# Patient Record
Sex: Female | Born: 1955 | Race: Black or African American | Hispanic: No | Marital: Married | Smoking: Never smoker
Health system: Southern US, Community
[De-identification: ages and names within clinical notes are randomized; demographics above are authoritative.]

## PROBLEM LIST (undated history)

## (undated) DIAGNOSIS — Z87442 Personal history of urinary calculi: Secondary | ICD-10-CM

## (undated) DIAGNOSIS — N200 Calculus of kidney: Secondary | ICD-10-CM

## (undated) DIAGNOSIS — F419 Anxiety disorder, unspecified: Secondary | ICD-10-CM

## (undated) DIAGNOSIS — M8580 Other specified disorders of bone density and structure, unspecified site: Secondary | ICD-10-CM

## (undated) DIAGNOSIS — D259 Leiomyoma of uterus, unspecified: Secondary | ICD-10-CM

## (undated) DIAGNOSIS — M1990 Unspecified osteoarthritis, unspecified site: Secondary | ICD-10-CM

## (undated) DIAGNOSIS — I10 Essential (primary) hypertension: Secondary | ICD-10-CM

## (undated) DIAGNOSIS — J189 Pneumonia, unspecified organism: Secondary | ICD-10-CM

## (undated) DIAGNOSIS — G43909 Migraine, unspecified, not intractable, without status migrainosus: Secondary | ICD-10-CM

## (undated) DIAGNOSIS — K760 Fatty (change of) liver, not elsewhere classified: Secondary | ICD-10-CM

## (undated) DIAGNOSIS — Z8742 Personal history of other diseases of the female genital tract: Secondary | ICD-10-CM

## (undated) HISTORY — PX: APPENDECTOMY: SHX54

## (undated) HISTORY — DX: Migraine, unspecified, not intractable, without status migrainosus: G43.909

## (undated) HISTORY — DX: Personal history of other diseases of the female genital tract: Z87.42

## (undated) HISTORY — PX: WISDOM TOOTH EXTRACTION: SHX21

## (undated) HISTORY — PX: VAGINAL HYSTERECTOMY: SUR661

## (undated) HISTORY — PX: CHOLECYSTECTOMY: SHX55

## (undated) HISTORY — PX: TONSILECTOMY, ADENOIDECTOMY, BILATERAL MYRINGOTOMY AND TUBES: SHX2538

## (undated) HISTORY — DX: Leiomyoma of uterus, unspecified: D25.9

## (undated) HISTORY — DX: Other specified disorders of bone density and structure, unspecified site: M85.80

## (undated) HISTORY — PX: FRACTURE SURGERY: SHX138

## (undated) HISTORY — DX: Calculus of kidney: N20.0

---

## 2001-06-23 ENCOUNTER — Encounter: Payer: Self-pay | Admitting: Obstetrics and Gynecology

## 2001-06-23 ENCOUNTER — Encounter: Admission: RE | Admit: 2001-06-23 | Discharge: 2001-06-23 | Payer: Self-pay | Admitting: Obstetrics and Gynecology

## 2002-05-21 HISTORY — PX: REFRACTIVE SURGERY: SHX103

## 2002-07-10 ENCOUNTER — Encounter: Admission: RE | Admit: 2002-07-10 | Discharge: 2002-07-10 | Payer: Self-pay | Admitting: Obstetrics and Gynecology

## 2002-07-10 ENCOUNTER — Encounter: Payer: Self-pay | Admitting: Obstetrics and Gynecology

## 2003-06-21 ENCOUNTER — Encounter (INDEPENDENT_AMBULATORY_CARE_PROVIDER_SITE_OTHER): Payer: Self-pay | Admitting: Specialist

## 2003-06-21 ENCOUNTER — Ambulatory Visit (HOSPITAL_COMMUNITY): Admission: RE | Admit: 2003-06-21 | Discharge: 2003-06-21 | Payer: Self-pay | Admitting: Gastroenterology

## 2004-12-21 ENCOUNTER — Encounter: Admission: RE | Admit: 2004-12-21 | Discharge: 2004-12-21 | Payer: Self-pay | Admitting: Obstetrics and Gynecology

## 2006-01-01 ENCOUNTER — Encounter: Admission: RE | Admit: 2006-01-01 | Discharge: 2006-01-01 | Payer: Self-pay | Admitting: Obstetrics and Gynecology

## 2006-01-09 ENCOUNTER — Encounter: Admission: RE | Admit: 2006-01-09 | Discharge: 2006-01-09 | Payer: Self-pay | Admitting: Obstetrics and Gynecology

## 2006-01-09 ENCOUNTER — Encounter: Admission: RE | Admit: 2006-01-09 | Discharge: 2006-01-09 | Payer: Self-pay | Admitting: Emergency Medicine

## 2006-01-31 ENCOUNTER — Ambulatory Visit (HOSPITAL_COMMUNITY): Admission: RE | Admit: 2006-01-31 | Discharge: 2006-01-31 | Payer: Self-pay | Admitting: Gastroenterology

## 2006-06-21 ENCOUNTER — Encounter: Admission: RE | Admit: 2006-06-21 | Discharge: 2006-06-21 | Payer: Self-pay | Admitting: Obstetrics and Gynecology

## 2007-01-06 ENCOUNTER — Encounter: Admission: RE | Admit: 2007-01-06 | Discharge: 2007-01-06 | Payer: Self-pay | Admitting: Obstetrics and Gynecology

## 2008-01-07 ENCOUNTER — Encounter: Admission: RE | Admit: 2008-01-07 | Discharge: 2008-01-07 | Payer: Self-pay | Admitting: Obstetrics and Gynecology

## 2009-01-06 ENCOUNTER — Encounter: Admission: RE | Admit: 2009-01-06 | Discharge: 2009-01-06 | Payer: Self-pay | Admitting: Obstetrics and Gynecology

## 2010-01-09 ENCOUNTER — Encounter: Admission: RE | Admit: 2010-01-09 | Discharge: 2010-01-09 | Payer: Self-pay | Admitting: Obstetrics and Gynecology

## 2010-06-11 ENCOUNTER — Encounter: Payer: Self-pay | Admitting: Gastroenterology

## 2010-10-06 NOTE — Op Note (Signed)
NAME:  Tracy Frye, Tracy Frye                 ACCOUNT NO.:  [id number]   MEDICAL RECORD NO.:  [id number]          PATIENT TYPE:  AMB   LOCATION:  ENDO                         FACILITY:  [facility]   PHYSICIAN:  John C. Madilyn Fireman, M.D.    DATE OF BIRTH:  1955/10/29   DATE OF PROCEDURE:  01/31/2006  DATE OF DISCHARGE:                                 OPERATIVE REPORT   PROCEDURE:  Endoscopic retrograde cholangiopancreatography with  sphincterotomy and stone extraction.   INDICATIONS FOR PROCEDURE:  Abdominal pain with elevated liver function  tests and gallstones and dilated common bile duct seen on ultrasound.   PROCEDURE:  The patient was placed in the prone position and placed on the  pulse monitor with continuous low-flow oxygen delivered by nasal cannula.  She was sedated with 160 micrograms IV fentanyl and 12.5 mg IV Versed.  Olympus video side-viewing endoscope was advanced blindly in the oropharynx,  esophagus and stomach.  The pylorus was traversed and papilla of Vater  located on the medial duodenal wall, had normal appearance and was ejecting  clear amber bile. Deep cannulation was achieved on the first attempt with  the Wilson-Cook sphincterotome and a cholangiogram obtained that showed a  tapered dilated duct with distal filling defect approximately 3-4 mm in  diameter.  A large sphincterotomy was performed and with the 8-15 mm  adjustable balloon the stone was pulled through the ampulla. Another balloon  sweep was made with no further stones delivered and occlusion cholangiogram  obtained which showed no further filling defects.  The scope was then  withdrawn and the patient returned to the recovery room in stable condition.  She tolerated the procedure well.  There were no immediate complications.   IMPRESSION:  Single distal common bile duct stone removed after  sphincterotomy.   PLAN:  We will arrange surgical consultation for cholecystectomy.            ______________________________  Everardo All Madilyn Fireman, M.D.     JCH/MEDQ  D:  01/31/2006  T:  02/01/2006  Job:  [id number]   cc:   Lillia Carmel, M.D.

## 2010-10-06 NOTE — Op Note (Signed)
NAME:  Tracy Frye, Tracy Frye                           ACCOUNT NO.:  [id number]   MEDICAL RECORD NO.:  [id number]                   PATIENT TYPE:  AMB   LOCATION:  ENDO                                 FACILITY:  [facility]   PHYSICIAN:  John C. Madilyn Fireman, M.D.                 DATE OF BIRTH:  [birth year]   DATE OF PROCEDURE:  06/21/2003  DATE OF DISCHARGE:                                 OPERATIVE REPORT   PROCEDURE:  Colonoscopy.   INDICATIONS FOR PROCEDURE:  Change in bowel habits, nocturnal diarrhea,  incontinence, and rectal bleeding in a 55 year old patient with no prior  colon screening.   DESCRIPTION OF PROCEDURE:  The patient was placed in the left lateral  decubitus position and placed on the pulse monitor with continuous low-flow  oxygen delivered by nasal cannula.  She was sedated with 125 mcg IV fentanyl  and 11 mg IV Versed.  The Olympus video colonoscope was inserted into the  rectum and advanced to the cecum, confirmed by transillumination of  McBurney's point and visualization of the ileocecal valve and appendiceal  orifice.  Prep was excellent.  The cecum, ascending, transverse, descending,  and sigmoid colon all appeared normal with no masses, polyps, diverticula,  or other mucosal abnormalities.  The rectum likewise appeared normal and  retroflexed view of the anus did reveal some small internal hemorrhoids.  Biopsies were taken to rule out microscopic colitis. The scope was then  withdrawn and the patient returned to the recovery room in stable condition.  She tolerated the procedure well and there were no immediate complications.   IMPRESSION:  Small internal hemorrhoids.  Otherwise normal study.   PLAN:  Await biopsy results. If negative, will probably start on an  antispasmodic.                                               John C. Madilyn Fireman, M.D.    JCH/MEDQ  D:  06/21/2003  T:  06/21/2003  Job:  [id number]

## 2011-01-23 ENCOUNTER — Other Ambulatory Visit: Payer: Self-pay | Admitting: Obstetrics and Gynecology

## 2011-01-23 DIAGNOSIS — Z1231 Encounter for screening mammogram for malignant neoplasm of breast: Secondary | ICD-10-CM

## 2011-02-14 ENCOUNTER — Ambulatory Visit: Payer: Self-pay

## 2011-04-18 ENCOUNTER — Other Ambulatory Visit: Payer: Self-pay | Admitting: Gastroenterology

## 2011-04-18 DIAGNOSIS — K838 Other specified diseases of biliary tract: Secondary | ICD-10-CM

## 2011-04-23 ENCOUNTER — Ambulatory Visit
Admission: RE | Admit: 2011-04-23 | Discharge: 2011-04-23 | Disposition: A | Discharge status: Home / Self Care | Payer: Managed Care, Other (non HMO) | Source: Ambulatory Visit | Attending: Gastroenterology | Admitting: Gastroenterology

## 2011-04-23 DIAGNOSIS — K838 Other specified diseases of biliary tract: Secondary | ICD-10-CM

## 2011-11-21 ENCOUNTER — Ambulatory Visit
Admission: RE | Admit: 2011-11-21 | Discharge: 2011-11-21 | Disposition: A | Discharge status: Home / Self Care | Payer: Managed Care, Other (non HMO) | Source: Ambulatory Visit | Attending: Obstetrics and Gynecology | Admitting: Obstetrics and Gynecology

## 2011-11-21 DIAGNOSIS — Z1231 Encounter for screening mammogram for malignant neoplasm of breast: Secondary | ICD-10-CM

## 2012-12-05 ENCOUNTER — Other Ambulatory Visit: Payer: Self-pay

## 2012-12-05 DIAGNOSIS — Z1231 Encounter for screening mammogram for malignant neoplasm of breast: Secondary | ICD-10-CM

## 2012-12-19 ENCOUNTER — Ambulatory Visit: Payer: Managed Care, Other (non HMO)

## 2013-10-19 ENCOUNTER — Other Ambulatory Visit: Payer: Self-pay

## 2013-11-04 ENCOUNTER — Ambulatory Visit
Admission: RE | Admit: 2013-11-04 | Discharge: 2013-11-04 | Disposition: A | Discharge status: Home / Self Care | Payer: Managed Care, Other (non HMO) | Source: Ambulatory Visit

## 2013-11-04 DIAGNOSIS — Z1231 Encounter for screening mammogram for malignant neoplasm of breast: Secondary | ICD-10-CM

## 2013-11-19 ENCOUNTER — Ambulatory Visit (INDEPENDENT_AMBULATORY_CARE_PROVIDER_SITE_OTHER): Payer: Managed Care, Other (non HMO)

## 2013-11-19 ENCOUNTER — Ambulatory Visit (INDEPENDENT_AMBULATORY_CARE_PROVIDER_SITE_OTHER): Payer: Managed Care, Other (non HMO) | Admitting: Podiatrist

## 2013-11-19 ENCOUNTER — Encounter: Payer: Self-pay | Admitting: Podiatrist

## 2013-11-19 VITALS — BP 118/67 | HR 74 | Resp 16 | Ht 67.0 in | Wt 185.0 lb

## 2013-11-19 DIAGNOSIS — M779 Enthesopathy, unspecified: Secondary | ICD-10-CM

## 2013-11-19 DIAGNOSIS — M7752 Other enthesopathy of left foot: Secondary | ICD-10-CM

## 2013-11-19 DIAGNOSIS — M7750 Other enthesopathy of unspecified foot: Secondary | ICD-10-CM

## 2013-11-19 MED ORDER — MELOXICAM 15 MG PO TABS: 15.0000 mg | ORAL_TABLET | Freq: Every day | ORAL | Status: DC

## 2013-11-19 MED ORDER — DEXAMETHASONE SODIUM PHOSPHATE 120 MG/30ML IJ SOLN: 4.0000 mg | Freq: Once | INTRAMUSCULAR | Status: DC

## 2013-11-19 NOTE — Patient Instructions (Signed)
Sprain °A sprain happens when the bands of tissue that connect bones and hold joints together (ligaments) stretch too much or tear. °HOME CARE °· Raise (elevate) the injured area to lessen puffiness (swelling). °· Put ice on the injured area 2 times a day for 2-3 days. °¨ Put ice in a plastic bag. °¨ Place a towel between your skin and the bag. °¨ Leave the ice on for 15 minutes. °· Only take medicine as told by your doctor. °· Protect your injured area until your pain and stiffness go away. °· Do not get your cast or splint wet. Cover your cast or splint with a plastic bag when you shower or take a bath. Do not swim in a pool. °· Your doctor may suggest exercises during your recovery to keep from getting stiff. °GET HELP RIGHT AWAY IF:  °· Your cast or splint becomes damaged. °· Your pain gets worse. °MAKE SURE YOU:  °· Understand these instructions. °· Will watch this condition. °· Will get help right away if you are not doing well or get worse. °Document Released: 10/24/2007 Document Revised: 02/25/2013 Document Reviewed: 05/19/2011 °ExitCare® Patient Information ©2015 [employer]. This information is not intended to replace advice given to you by your health care provider. Make sure you discuss any questions you have with your health care provider. ° °

## 2013-11-19 NOTE — Progress Notes (Signed)
   Subjective:    Patient ID: Tracy Frye, female    DOB: 21-Jul-1955, 58 y.o.   MRN: [id number]  HPI Comments: Its my left ankle. Its been hurting off an on for 2 -3 months. Its getting worse. At night its a throbbing pain. i wear a compression sock, good shoes with support. i take a benadryl and tylenol combination for it.  Foot Pain      Review of Systems  Gastrointestinal: Positive for diarrhea.  Musculoskeletal:       Joint pain Back pain Muscle pain  Allergic/Immunologic: Positive for environmental allergies.  Hematological: Bruises/bleeds easily.  All other systems reviewed and are negative.      Objective:   Physical Exam Patient is awake, alert, and oriented x 3.  In no acute distress.  Vascular status is intact with palpable pedal pulses at 2/4 DP and PT bilateral and capillary refill time within normal limits. Neurological sensation is also intact bilaterally via Semmes Weinstein monofilament at 5/5 sites. Light touch, vibratory sensation, Achilles tendon reflex is intact. Dermatological exam reveals skin color, turger and texture as normal. No open lesions present.  Musculature intact with dorsiflexion, plantarflexion, inversion, eversion.  Pain on lateral ankle at th sinus tarsi is noted with direct pressure.  No pain along the peroneal tendons is noted. No pain with anterior drawer test. Pain with dorsal lateral movement in the ankle joint is noted. X-rays show some slight arthritis only.    Assessment & Plan:  Sinus tarsi syndrome, capsulitis left Plan: Injected into the sinus tarsi of the left foot. Recommended a soft ankle brace. If there is no improvement she will call in 2 weeks we will consider an MRI. Also recommended MOBIC anti-inflammatory which was electronically prescribed.

## 2013-11-20 ENCOUNTER — Telehealth: Payer: Self-pay | Admitting: *Deleted

## 2013-11-20 MED ORDER — MELOXICAM 15 MG PO TABS: 15.0000 mg | ORAL_TABLET | Freq: Every day | ORAL | Status: DC

## 2013-11-20 NOTE — Telephone Encounter (Signed)
The pain medication that doctor egerton was going to prescribe was not at pharmacy, resent medication to pharmacy

## 2013-11-26 ENCOUNTER — Other Ambulatory Visit: Payer: Self-pay | Admitting: *Deleted

## 2013-11-26 ENCOUNTER — Telehealth: Payer: Self-pay | Admitting: *Deleted

## 2013-11-26 MED ORDER — DICLOFENAC SODIUM 75 MG PO TBEC: 75.0000 mg | DELAYED_RELEASE_TABLET | Freq: Two times a day (BID) | ORAL | Status: DC

## 2013-11-26 MED ORDER — TRAMADOL HCL 50 MG PO TABS: ORAL_TABLET | ORAL | Status: DC

## 2013-11-26 NOTE — Telephone Encounter (Signed)
Pt called stated she is dr egertons pt and is having pain in her left ankle and is wanting pain medicine. States she has been taking mobic and its not helping. What do you suggest?

## 2013-11-26 NOTE — Telephone Encounter (Signed)
Change to Diclofenac 75mg  one by mouth twice daily. #60 1 refill also add Tramadol 50 mg #50, 1-2 by mouth every six to eight hours.

## 2013-11-26 NOTE — Telephone Encounter (Signed)
PER DR HYATT PT CAN HAVE DICLOFENAC 75 MG, TAKE TWICE DAILY, #60 WITH 1 REFILL AND TRAMADOL 50 MG, #50 ONE TO TWO BY MOUTH EVERY 6 -8 HRS PRN PAIN NO REFILLS. PT IS AWARE THAT SHE WILL NEED TO COME BY OFFICE TO PICK UP RX.

## 2014-02-25 ENCOUNTER — Other Ambulatory Visit: Payer: Self-pay | Admitting: *Deleted

## 2014-02-25 MED ORDER — MELOXICAM 15 MG PO TABS: 15.0000 mg | ORAL_TABLET | Freq: Every day | ORAL | Status: DC

## 2014-02-25 NOTE — Telephone Encounter (Signed)
Refill req from [facility]. Refilled meloxicam 15 mg #30 with 2 refills.

## 2014-03-02 ENCOUNTER — Other Ambulatory Visit: Payer: Self-pay | Admitting: *Deleted

## 2014-03-02 MED ORDER — MELOXICAM 15 MG PO TABS: 15.0000 mg | ORAL_TABLET | Freq: Every day | ORAL | Status: DC

## 2014-03-02 NOTE — Telephone Encounter (Signed)
FAX REQUEST FROM [facility] REQ MELOXICAM 15 MG #30. REFILLED PER DR EGERTON.

## 2014-09-28 ENCOUNTER — Ambulatory Visit: Payer: Managed Care, Other (non HMO) | Admitting: Podiatrist

## 2014-09-29 ENCOUNTER — Encounter: Payer: Self-pay | Admitting: Podiatrist

## 2014-09-29 ENCOUNTER — Ambulatory Visit (INDEPENDENT_AMBULATORY_CARE_PROVIDER_SITE_OTHER): Payer: Managed Care, Other (non HMO) | Admitting: Podiatrist

## 2014-09-29 VITALS — BP 127/80 | HR 70 | Resp 12

## 2014-09-29 DIAGNOSIS — B351 Tinea unguium: Secondary | ICD-10-CM

## 2014-09-29 DIAGNOSIS — M7752 Other enthesopathy of left foot: Secondary | ICD-10-CM | POA: Diagnosis not present

## 2014-09-29 NOTE — Patient Instructions (Signed)

## 2014-09-29 NOTE — Progress Notes (Signed)
l ankle sinus tarsi injection... And sample left hallux nail.  Chief Complaint  Patient presents with  . Foot Pain    ''LT FOOT ANKLE START SWELLING AND PAINFUL FOR ABOUT 1 MONTH.''     HPI: Patient is 59 y.o. female who presents today for recurrence of pain along the outside of the left ankle.  She had this problem about a year ago and a sinus tarsi injection helped her discomfort.  She also has a hallux nail on the left foot she is concerned about being fungal.    Allergies  Allergen Reactions  . Sulfa Antibiotics Rash    Physical Exam  Neurovascular status is intact and unchanged. Pes planus with impingement along the sinus tarsi is noted.  Pain along the sinus tarsi of the left ankle is noted in consistent with the pain she experienced before. Left hallux is somewhat thickened with a darkish streak down the nail. Does appear consistent with age related changes and skin pigmentation of the nail  Assessment:  Sinus tarsi syndrome left ankle, dystrophy of hallux nail left  Plan: under sterile technique the sinus tarsi was injected on the left ankle.  The left hallux  Nail sample was sent for identification.  Will notify her of the result.  She will call if the injection is not beneficial.

## 2015-01-13 ENCOUNTER — Other Ambulatory Visit: Payer: Self-pay

## 2015-01-13 DIAGNOSIS — Z1231 Encounter for screening mammogram for malignant neoplasm of breast: Secondary | ICD-10-CM

## 2015-01-27 ENCOUNTER — Ambulatory Visit: Payer: Managed Care, Other (non HMO)

## 2015-02-08 ENCOUNTER — Telehealth: Payer: Self-pay | Admitting: *Deleted

## 2015-02-08 NOTE — Telephone Encounter (Signed)
Pt request refill of a medication.  I spoke with pt she request refill of Diclofenac.  I did not see that rx prescribed in our listing and pt needs to be evaluated and established with a new doctor.  I offered pt an appt, she says she will check her schedule and call back.

## 2015-02-23 ENCOUNTER — Ambulatory Visit: Payer: Managed Care, Other (non HMO) | Admitting: Podiatry

## 2015-03-16 ENCOUNTER — Ambulatory Visit: Payer: Managed Care, Other (non HMO)

## 2015-03-18 ENCOUNTER — Ambulatory Visit (INDEPENDENT_AMBULATORY_CARE_PROVIDER_SITE_OTHER): Payer: Managed Care, Other (non HMO)

## 2015-03-18 ENCOUNTER — Ambulatory Visit (INDEPENDENT_AMBULATORY_CARE_PROVIDER_SITE_OTHER): Payer: Managed Care, Other (non HMO) | Admitting: Sports Medicine

## 2015-03-18 ENCOUNTER — Encounter: Payer: Self-pay | Admitting: Sports Medicine

## 2015-03-18 DIAGNOSIS — M2142 Flat foot [pes planus] (acquired), left foot: Secondary | ICD-10-CM

## 2015-03-18 DIAGNOSIS — M25572 Pain in left ankle and joints of left foot: Secondary | ICD-10-CM

## 2015-03-18 DIAGNOSIS — M2141 Flat foot [pes planus] (acquired), right foot: Secondary | ICD-10-CM

## 2015-03-18 DIAGNOSIS — M7752 Other enthesopathy of left foot: Secondary | ICD-10-CM

## 2015-03-18 MED ORDER — TRIAMCINOLONE ACETONIDE 10 MG/ML IJ SUSP: 10.0000 mg | Freq: Once | INTRAMUSCULAR | Status: DC

## 2015-03-18 MED ORDER — DICLOFENAC SODIUM 75 MG PO TBEC: 75.0000 mg | DELAYED_RELEASE_TABLET | Freq: Two times a day (BID) | ORAL | Status: DC

## 2015-03-18 NOTE — Progress Notes (Signed)
Patient ID: Tracy CollumSharon M Corkum, female   DOB: 10/20/1955, 59 y.o.   MRN: [id number] Subjective: Tracy Frye is a 59 y.o. female patient who presents to office for evaluation of Left ankle pain. 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 Reports that Dr. Irving ShowsEgerton gave her an injection and anti-inflammatories in May which helped a lot. Patient desires a repeat injection. Patient denies any other pedal complaints. Denies recent injury/trip/fall/sprain/any causative factors.   There are no active problems to display for this patient.  Current Outpatient Prescriptions on File Prior to Visit  Medication Sig Dispense Refill  . meloxicam (MOBIC) 15 MG tablet Take 1 tablet (15 mg total) by mouth daily. 30 tablet 2   Current Facility-Administered Medications on File Prior to Visit  Medication Dose Route Frequency Provider Last Rate Last Dose  . dexamethasone (DECADRON) injection 4 mg  4 mg Intra-articular Once Delories HeinzKathryn P Egerton, DPM       Allergies  Allergen Reactions  . Sulfa Antibiotics Rash   Objective:  General: Alert and oriented x3 in no acute distress  Dermatology: No open lesions bilateral lower extremities, no webspace macerations, no ecchymosis bilateral, all nails x 10 are well manicured.  Vascular: Dorsalis Pedis and Posterior Tibial pedal pulses 2/4, Capillary Fill Time 3 seconds,(+) pedal hair growth bilateral, focal edema to the left ankle, Temperature gradient within normal limits.  Neurology: Gross sensation intact via light touch bilateral, Protective sensation intact  with Semmes Weinstein Monofilament to all pedal sites, Position sense intact, vibratory intact bilateral, Deep tendon reflexes within normal limits bilateral, No babinski sign present bilateral. (-)Tinels sign left foot.   Musculoskeletal: Mild tenderness with  palpation at lateral ankle at ATF < sinus tarsi on Left,No pain with calf compression bilateral. Ankle, Subtalar joint, Midtarsal, MTPJ range of motion is within normal limits except with dorsiflexion there is mild discomfort, there is no 1st ray hypermobility noted bilateral, collapsing pes planus with impingement at ankle on weightbearing.Strength within normal limits in all groups bilateral.  Assessment and Plan: Problem List Items Addressed This Visit    None    Visit Diagnoses    Ankle pain, left    -  Primary    Relevant Medications    diclofenac (VOLTAREN) 75 MG EC tablet    Other Relevant Orders    DG Ankle Complete Left    Capsulitis of ankle, left        Relevant Medications    aspirin EC 81 MG tablet    triamcinolone acetonide (KENALOG) 10 MG/ML injection 10 mg    diclofenac (VOLTAREN) 75 MG EC tablet    Pes planus of both feet        L>R      -Complete examination performed -Previous Xrays reviewed -Discussed treatement options -After verbal consent, injected 1cc lidocaine plain, 1cc marcaine plain, 0.5cc dex, 0.5cc kenalog 10 into most tender point at sinus tarsi on left ankle without complication -Rx Diclofenac 75mg  bid -Recommend to wear ankle brace; may consider other bracing options or orthoses for additional mechanical control in the future -Recommend to gentle propreceptive stretches, ice, elevate, and epsom salts as needed -Patient to return to office as needed or sooner if condition worsens.  Asencion Islamitorya Stepheni Cameron, DPM

## 2015-03-18 NOTE — Patient Instructions (Signed)

## 2015-04-05 ENCOUNTER — Ambulatory Visit: Payer: Managed Care, Other (non HMO)

## 2015-05-17 ENCOUNTER — Telehealth: Payer: Self-pay | Admitting: *Deleted

## 2015-05-17 NOTE — Telephone Encounter (Signed)
Pt request refill of Diclofenac.  Informed pt, Dr. Marylene LandStover had wanted to see her again if she had not improved on the prescribed therapy.  Pt agreed and was transferred to schedulers.

## 2015-05-20 ENCOUNTER — Ambulatory Visit (INDEPENDENT_AMBULATORY_CARE_PROVIDER_SITE_OTHER): Payer: Managed Care, Other (non HMO) | Admitting: Sports Medicine

## 2015-05-20 ENCOUNTER — Encounter: Payer: Self-pay | Admitting: Sports Medicine

## 2015-05-20 DIAGNOSIS — M7752 Other enthesopathy of left foot: Secondary | ICD-10-CM | POA: Diagnosis not present

## 2015-05-20 DIAGNOSIS — M2142 Flat foot [pes planus] (acquired), left foot: Secondary | ICD-10-CM

## 2015-05-20 DIAGNOSIS — M25572 Pain in left ankle and joints of left foot: Secondary | ICD-10-CM

## 2015-05-20 DIAGNOSIS — M2141 Flat foot [pes planus] (acquired), right foot: Secondary | ICD-10-CM

## 2015-05-20 MED ORDER — TRIAMCINOLONE ACETONIDE 10 MG/ML IJ SUSP: 10.0000 mg | Freq: Once | INTRAMUSCULAR | Status: DC

## 2015-05-20 MED ORDER — DICLOFENAC SODIUM 75 MG PO TBEC: 75.0000 mg | DELAYED_RELEASE_TABLET | Freq: Two times a day (BID) | ORAL | Status: DC

## 2015-05-20 NOTE — Progress Notes (Signed)
Patient ID: Tracy Frye, female   DOB: 1956-01-12, 59 y.o.   MRN: [id number]  Subjective: Tracy Frye is a 59 y.o. female patient who returns to office for evaluation of Left ankle pain. 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 Patient denies any other pedal complaints.   There are no active problems to display for this patient.  Current Outpatient Prescriptions on File Prior to Visit  Medication Sig Dispense Refill  . aspirin EC 81 MG tablet Take 81 mg by mouth.    . meloxicam (MOBIC) 15 MG tablet Take 1 tablet (15 mg total) by mouth daily. 30 tablet 2   Current Facility-Administered Medications on File Prior to Visit  Medication Dose Route Frequency Provider Last Rate Last Dose  . dexamethasone (DECADRON) injection 4 mg  4 mg Intra-articular Once Delories HeinzKathryn P Egerton, DPM      . triamcinolone acetonide (KENALOG) 10 MG/ML injection 10 mg  10 mg Other Once Tracy Islamitorya Magnus Crescenzo, DPM       Allergies  Allergen Reactions  . Sulfa Antibiotics Rash   Objective:  General: Alert and oriented x3 in no acute distress  Dermatology: No open lesions bilateral lower extremities, no webspace macerations, no ecchymosis bilateral, all nails x 10 are well manicured.  Vascular: Dorsalis Pedis and Posterior Tibial pedal pulses 2/4, Capillary Fill Time 3 seconds,(+) pedal hair growth bilateral, focal edema to the left ankle, Temperature gradient within normal limits.  Neurology: Gross sensation intact via light touch bilateral, Protective sensation intact  with Semmes Weinstein Monofilament to all pedal sites, Position sense intact, vibratory intact bilateral, Deep tendon reflexes within normal limits bilateral, No babinski sign present bilateral. (-)Tinels sign left foot.   Musculoskeletal: Mild tenderness with palpation at lateral ankle at  ATF < sinus tarsi on Left,No pain with calf compression bilateral. Ankle, Subtalar joint, Midtarsal, MTPJ range of motion is within normal limits except with dorsiflexion there is mild discomfort, there is no 1st ray hypermobility noted bilateral, collapsing pes planus with impingement at ankle on weightbearing.Strength within normal limits in all groups bilateral.  Assessment and Plan: Problem List Items Addressed This Visit    None    Visit Diagnoses    Ankle pain, left    -  Primary    Relevant Medications    triamcinolone acetonide (KENALOG) 10 MG/ML injection 10 mg    diclofenac (VOLTAREN) 75 MG EC tablet    Capsulitis of ankle, left        Relevant Medications    triamcinolone acetonide (KENALOG) 10 MG/ML injection 10 mg    diclofenac (VOLTAREN) 75 MG EC tablet    Pes planus of both feet          -Complete examination performed -Previous Xrays reviewed -Discussed treatement options -After verbal consent, injected (#2) 1cc lidocaine plain, 1cc marcaine plain, 0.5cc dex, 0.5cc kenalog 10 into most tender point at sinus tarsi on left ankle without complication -Refilled Diclofenac 75mg  bid -Recommend to wear ankle brace; may consider other bracing options or orthoses for additional mechanical control in the future and MRI to further eval ankle if pain persists. -Recommend to gentle propreceptive stretches, ice, elevate, and epsom salts as needed -Patient to return to office in 2 weeks/as needed or sooner if condition worsens.  Tracy Frye, DPM

## 2015-06-10 ENCOUNTER — Encounter: Payer: Self-pay | Admitting: Sports Medicine

## 2015-06-10 ENCOUNTER — Ambulatory Visit (INDEPENDENT_AMBULATORY_CARE_PROVIDER_SITE_OTHER): Payer: Managed Care, Other (non HMO) | Admitting: Sports Medicine

## 2015-06-10 DIAGNOSIS — R600 Localized edema: Secondary | ICD-10-CM | POA: Diagnosis not present

## 2015-06-10 DIAGNOSIS — M25572 Pain in left ankle and joints of left foot: Secondary | ICD-10-CM

## 2015-06-10 DIAGNOSIS — M7752 Other enthesopathy of left foot: Secondary | ICD-10-CM | POA: Diagnosis not present

## 2015-06-10 DIAGNOSIS — M779 Enthesopathy, unspecified: Secondary | ICD-10-CM | POA: Diagnosis not present

## 2015-06-10 DIAGNOSIS — M2142 Flat foot [pes planus] (acquired), left foot: Secondary | ICD-10-CM

## 2015-06-10 DIAGNOSIS — M2141 Flat foot [pes planus] (acquired), right foot: Secondary | ICD-10-CM

## 2015-06-10 NOTE — Progress Notes (Signed)
Patient ID: Tracy Frye, female   DOB: 1955/09/25, 60 y.o.   MRN: [id number]  Subjective: Tracy Frye is a 60 y.o. female patient who returns to office for evaluation of Left ankle pain. Patient complains of continued swelling but no pain to left ankle; stopped wearing ankle brace last week with no problems and stopped anti-inflammatory because she wasn't in any pain. Patient denies any other pedal complaints.   There are no active problems to display for this patient.  Current Outpatient Prescriptions on File Prior to Visit  Medication Sig Dispense Refill  . aspirin EC 81 MG tablet Take 81 mg by mouth.    . diclofenac (VOLTAREN) 75 MG EC tablet Take 1 tablet (75 mg total) by mouth 2 (two) times daily. 30 tablet 0  . meloxicam (MOBIC) 15 MG tablet Take 1 tablet (15 mg total) by mouth daily. 30 tablet 2   Current Facility-Administered Medications on File Prior to Visit  Medication Dose Route Frequency Provider Last Rate Last Dose  . dexamethasone (DECADRON) injection 4 mg  4 mg Intra-articular Once Delories Heinz, DPM      . triamcinolone acetonide (KENALOG) 10 MG/ML injection 10 mg  10 mg Other Once [employer], DPM      . triamcinolone acetonide (KENALOG) 10 MG/ML injection 10 mg  10 mg Other Once Asencion Islam, DPM       Allergies  Allergen Reactions  . Sulfa Antibiotics Rash   Objective:  General: Alert and oriented x3 in no acute distress  Dermatology: No open lesions bilateral lower extremities, no webspace macerations, no ecchymosis bilateral, all nails x 10 are well manicured.  Vascular: Dorsalis Pedis and Posterior Tibial pedal pulses 2/4, Capillary Fill Time 3 seconds,(+) pedal hair growth bilateral, + focal edema to the left lateral ankle, Temperature gradient within normal limits.  Neurology: Gross sensation intact via light touch bilateral, Protective sensation intact  with Semmes Weinstein Monofilament to all pedal sites, Position sense intact, vibratory intact  bilateral, Deep tendon reflexes within normal limits bilateral, No babinski sign present bilateral. (-)Tinels sign left foot.   Musculoskeletal: No tenderness with palpation at lateral ankle at ATF < sinus tarsi on Left, No pain with calf compression bilateral. Ankle, Subtalar joint, Midtarsal, MTPJ range of motion is within normal limits, there is no 1st ray hypermobility noted bilateral, collapsing pes planus with impingement at ankle on weightbearing.Strength within normal limits in all groups bilateral.  Assessment and Plan: Problem List Items Addressed This Visit    None    Visit Diagnoses    Ankle pain, left    -  Primary    Capsulitis of ankle, left        Tendonitis        Pes planus of both feet        Localized edema        Left ankle      -Complete examination performed -Discussed treatement options -Dispensed left compression stocking to assist with edema control -Recommend to gentle propreceptive stretches, ice, elevate, and epsom salts as needed -If no improvement will consider MRI. If negative PT for treatment with modalities.  -Patient to return to office in 6 weeks or sooner if condition worsens.  Asencion Islam, DPM

## 2015-07-22 ENCOUNTER — Ambulatory Visit: Payer: Managed Care, Other (non HMO) | Admitting: Sports Medicine

## 2015-07-22 ENCOUNTER — Ambulatory Visit
Admission: RE | Admit: 2015-07-22 | Discharge: 2015-07-22 | Disposition: A | Discharge status: Home / Self Care | Payer: Managed Care, Other (non HMO) | Source: Ambulatory Visit

## 2015-07-22 DIAGNOSIS — Z1231 Encounter for screening mammogram for malignant neoplasm of breast: Secondary | ICD-10-CM

## 2015-09-13 ENCOUNTER — Encounter: Payer: Self-pay | Admitting: Sports Medicine

## 2015-09-13 ENCOUNTER — Telehealth: Payer: Self-pay | Admitting: *Deleted

## 2015-09-13 ENCOUNTER — Ambulatory Visit (INDEPENDENT_AMBULATORY_CARE_PROVIDER_SITE_OTHER): Payer: Managed Care, Other (non HMO) | Admitting: Sports Medicine

## 2015-09-13 VITALS — BP 106/74 | HR 86 | Resp 12

## 2015-09-13 DIAGNOSIS — M779 Enthesopathy, unspecified: Secondary | ICD-10-CM | POA: Diagnosis not present

## 2015-09-13 DIAGNOSIS — M2142 Flat foot [pes planus] (acquired), left foot: Secondary | ICD-10-CM

## 2015-09-13 DIAGNOSIS — M25572 Pain in left ankle and joints of left foot: Secondary | ICD-10-CM | POA: Diagnosis not present

## 2015-09-13 DIAGNOSIS — T148XXA Other injury of unspecified body region, initial encounter: Secondary | ICD-10-CM

## 2015-09-13 DIAGNOSIS — M2141 Flat foot [pes planus] (acquired), right foot: Secondary | ICD-10-CM

## 2015-09-13 DIAGNOSIS — M7752 Other enthesopathy of left foot: Secondary | ICD-10-CM

## 2015-09-13 MED ORDER — DICLOFENAC SODIUM 75 MG PO TBEC: 75.0000 mg | DELAYED_RELEASE_TABLET | Freq: Two times a day (BID) | ORAL | Status: DC

## 2015-09-13 NOTE — Telephone Encounter (Addendum)
-----   Message from Asencion Islamitorya [place], [place] sent at 09/13/2015  2:06 PM EDT ----- Regarding: MRI Please order MRI Left ankle r/o ligament and tendon tear at lateral ankle   Thanks Dr. Marylene LandStover.  Orders for MRI left ankle in EPIC and given to D. [place] for prior authorization.  09/14/2015-[employer] DOES NOT REQUIRE PRIOR AUTHORIZATION FOR [id number] MRI, REFERENCE# [id number]. FAXED.  09/19/2015-Pt states she has not received a call to schedule her MRI and I told her we had faxed the orders which her insurance stated did not need pre-cert [facility] and she could call to set up her appt.  I gave pt the appt line, and I refaxed with the confirmation of 09/14/2015 receipt of orders and pre-cert information.

## 2015-09-13 NOTE — Progress Notes (Signed)
Patient ID: Tracy Frye, female   DOB: 01-Jan-1956, 60 y.o.   MRN: [id number] Subjective: Tracy CollumSharon M Frye is a 60 y.o. female patient who returns to office for evaluation of Left ankle pain. Patient complains of continued swelling and recurrence of pain to left ankle; went to Urgent care over spring break was given CAM boot and Mobic of which she did not tolerate well. Patient denies any other pedal complaints.   There are no active problems to display for this patient.  Current Outpatient Prescriptions on File Prior to Visit  Medication Sig Dispense Refill  . aspirin EC 81 MG tablet Take 81 mg by mouth.    . diclofenac (VOLTAREN) 75 MG EC tablet Take 1 tablet (75 mg total) by mouth 2 (two) times daily. 30 tablet 0  . meloxicam (MOBIC) 15 MG tablet Take 1 tablet (15 mg total) by mouth daily. 30 tablet 2   Current Facility-Administered Medications on File Prior to Visit  Medication Dose Route Frequency Provider Last Rate Last Dose  . dexamethasone (DECADRON) injection 4 mg  4 mg Intra-articular Once Delories HeinzKathryn P Egerton, DPM      . triamcinolone acetonide (KENALOG) 10 MG/ML injection 10 mg  10 mg Other Once [employer] Kendy Haston, DPM      . triamcinolone acetonide (KENALOG) 10 MG/ML injection 10 mg  10 mg Other Once Asencion Islamitorya Onell Mcmath, DPM       Allergies  Allergen Reactions  . Sulfa Antibiotics Rash   Objective:  General: Alert and oriented x3 in no acute distress  Dermatology: No open lesions bilateral lower extremities, no webspace macerations, no ecchymosis bilateral, all nails x 10 are well manicured.  Vascular: Dorsalis Pedis and Posterior Tibial pedal pulses 2/4, Capillary Fill Time 3 seconds,(+) pedal hair growth bilateral, + focal edema to the left lateral ankle, Temperature gradient within normal limits.  Neurology: Gross sensation intact via light touch bilateral, Protective sensation intact  with Semmes Weinstein Monofilament to all pedal sites, Position sense intact, vibratory intact  bilateral, Deep tendon reflexes within normal limits bilateral, No babinski sign present bilateral. (-)Tinels sign left foot.   Musculoskeletal: There is tenderness with palpation at lateral ankle at ATF < sinus tarsi on Left, No pain with calf compression bilateral. Ankle, Subtalar joint, Midtarsal, MTPJ range of motion is within normal limits, there is no 1st ray hypermobility noted bilateral, collapsing pes planus with impingement at ankle on weightbearing.Strength within normal limits in all groups bilateral.  Assessment and Plan: Problem List Items Addressed This Visit    None    Visit Diagnoses    Ankle pain, left    -  Primary    Capsulitis of ankle, left        Tendonitis        Pes planus of both feet          -Complete examination performed -Discussed treatement options -Cont with left compression stocking to assist with edema control -Recommend cont with tri-lock with long periods of standing and walking to protect ankle -Recommend cont with ice, elevate, and epsom salts as needed -Rx Diclofenac to use instead of mobic -Rx MRI to further evaluate Left ankle;  If negative PT for treatment with modalities.  -Patient to return to office after MRI or sooner if condition worsens.  Asencion Islamitorya Xaviar Lunn, DPM

## 2015-09-24 ENCOUNTER — Ambulatory Visit
Admission: RE | Admit: 2015-09-24 | Discharge: 2015-09-24 | Disposition: A | Discharge status: Home / Self Care | Payer: Managed Care, Other (non HMO) | Source: Ambulatory Visit | Attending: Sports Medicine | Admitting: Sports Medicine

## 2015-09-24 DIAGNOSIS — T148XXA Other injury of unspecified body region, initial encounter: Secondary | ICD-10-CM

## 2015-10-06 ENCOUNTER — Telehealth: Payer: Self-pay | Admitting: Sports Medicine

## 2015-10-06 NOTE — Telephone Encounter (Signed)
Pt called asking for a call back to schedule an appt. I returned call and had to leave a message for pt to call me back to schedule an appt.

## 2015-10-18 ENCOUNTER — Encounter: Payer: Self-pay | Admitting: Sports Medicine

## 2015-10-18 ENCOUNTER — Ambulatory Visit (INDEPENDENT_AMBULATORY_CARE_PROVIDER_SITE_OTHER): Payer: Managed Care, Other (non HMO) | Admitting: Sports Medicine

## 2015-10-18 DIAGNOSIS — M25572 Pain in left ankle and joints of left foot: Secondary | ICD-10-CM

## 2015-10-18 DIAGNOSIS — M7752 Other enthesopathy of left foot: Secondary | ICD-10-CM

## 2015-10-18 DIAGNOSIS — M1990 Unspecified osteoarthritis, unspecified site: Secondary | ICD-10-CM | POA: Diagnosis not present

## 2015-10-18 MED ORDER — TRIAMCINOLONE ACETONIDE 10 MG/ML IJ SUSP: 10.0000 mg | Freq: Once | INTRAMUSCULAR | Status: DC

## 2015-10-18 MED ORDER — DICLOFENAC SODIUM 75 MG PO TBEC: 75.0000 mg | DELAYED_RELEASE_TABLET | Freq: Two times a day (BID) | ORAL | Status: DC

## 2015-10-18 NOTE — Progress Notes (Signed)
Patient ID: Tracy Frye, female   DOB: November 13, 1955, 60 y.o.   MRN: [id number]   Subjective: Tracy CollumSharon M Frye is a 60 y.o. female patient who returns to office for evaluation of Left ankle pain and for MRI results. Patient complains of continued swelling and pain to left ankle. Patient denies any other pedal complaints.   There are no active problems to display for this patient.  Current Outpatient Prescriptions on File Prior to Visit  Medication Sig Dispense Refill  . aspirin EC 81 MG tablet Take 81 mg by mouth.    . meloxicam (MOBIC) 15 MG tablet Take 1 tablet (15 mg total) by mouth daily. 30 tablet 2   Current Facility-Administered Medications on File Prior to Visit  Medication Dose Route Frequency Provider Last Rate Last Dose  . dexamethasone (DECADRON) injection 4 mg  4 mg Intra-articular Once Delories HeinzKathryn P Egerton, DPM      . triamcinolone acetonide (KENALOG) 10 MG/ML injection 10 mg  10 mg Other Once [employer] Tanja Gift, DPM      . triamcinolone acetonide (KENALOG) 10 MG/ML injection 10 mg  10 mg Other Once Asencion Islamitorya Ayriel Texidor, DPM       Allergies  Allergen Reactions  . Sulfa Antibiotics Rash   Objective:  General: Alert and oriented x3 in no acute distress  Dermatology: No open lesions bilateral lower extremities, no webspace macerations, no ecchymosis bilateral, all nails x 10 are well manicured.  Vascular: Dorsalis Pedis and Posterior Tibial pedal pulses 2/4, Capillary Fill Time 3 seconds,(+) pedal hair growth bilateral, + focal edema to the left lateral ankle, Temperature gradient within normal limits.  Neurology: Gross sensation intact via light touch bilateral, Protective sensation intact  with Semmes Weinstein Monofilament to all pedal sites, Position sense intact, vibratory intact bilateral, Deep tendon reflexes within normal limits bilateral, No babinski sign present bilateral. (-)Tinels sign left foot.   Musculoskeletal: There is tenderness with palpation at lateral ankle at ATF < sinus  tarsi on Left, No pain with calf compression bilateral. Ankle, Subtalar joint, Midtarsal, MTPJ range of motion is within normal limits, there is no 1st ray hypermobility noted bilateral, collapsing pes planus with impingement at ankle on weightbearing.Strength within normal limits in all groups bilateral.  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  Assessment and Plan: Problem List Items Addressed This Visit    None    Visit Diagnoses    Ankle pain, left    -  Primary    Relevant Medications    diclofenac (VOLTAREN) 75 MG EC tablet    triamcinolone acetonide (KENALOG) 10 MG/ML injection 10 mg    Capsulitis of ankle, left        Relevant Medications    diclofenac (VOLTAREN) 75 MG EC tablet    triamcinolone acetonide (KENALOG) 10 MG/ML injection 10 mg    Arthritis        Relevant Medications    diclofenac (VOLTAREN) 75 MG EC tablet    triamcinolone acetonide (KENALOG) 10 MG/ML injection 10 mg      -Complete examination performed -MRI reviewed -Discussed treatement options. Encouraged patient if continues to progress may benefit from STJ fusion  - After oral consent and aseptic prep, injected a mixture containing 1 ml of 2%  plain lidocaine, 1 ml 0.5% plain marcaine, 0.5 ml of kenalog 10 and 0.5 ml of dexamethasone phosphate into left sinus tarsi without complication. Post-injection care discussed with patient.  -Refilled diclofenac to take as needed for pain and inflammation  -Cont with left compression  stocking to assist with edema control -Recommend cont with tri-lock with long periods of standing and walking to protect ankle -Recommend cont with ice, elevate, and epsom salts as needed -Patient to return to office 4-6 weeks or sooner if condition worsens.  Asencion Islam, DPM

## 2015-11-09 ENCOUNTER — Other Ambulatory Visit: Payer: Self-pay | Admitting: Sports Medicine

## 2015-11-09 NOTE — Telephone Encounter (Signed)
Pt was instructed to make a follow up appt.

## 2015-11-15 ENCOUNTER — Ambulatory Visit (INDEPENDENT_AMBULATORY_CARE_PROVIDER_SITE_OTHER): Payer: Managed Care, Other (non HMO) | Admitting: Sports Medicine

## 2015-11-15 ENCOUNTER — Encounter: Payer: Self-pay | Admitting: Sports Medicine

## 2015-11-15 DIAGNOSIS — M7752 Other enthesopathy of left foot: Secondary | ICD-10-CM

## 2015-11-15 DIAGNOSIS — M1990 Unspecified osteoarthritis, unspecified site: Secondary | ICD-10-CM

## 2015-11-15 DIAGNOSIS — M25572 Pain in left ankle and joints of left foot: Secondary | ICD-10-CM

## 2015-11-15 NOTE — Progress Notes (Signed)
Patient ID: Tracy CollumSharon M Frye, female   DOB: 25-Dec-1955, 60 y.o.   MRN: [id number]  Subjective: Tracy Frye is a 60 y.o. female patient who returns to office for evaluation of Left ankle pain; Reports that she is doing well; continued swelling but NO pain. Patient has been taking diclofenac once daily without problems and wearing trilock brace as needed. Patient denies any other pedal complaints.   There are no active problems to display for this patient.  Current Outpatient Prescriptions on File Prior to Visit  Medication Sig Dispense Refill  . aspirin EC 81 MG tablet Take 81 mg by mouth.    . diclofenac (VOLTAREN) 75 MG EC tablet TAKE 1 TABLET BY MOUTH 2 (TWO) TIMES DAILY. 15 tablet 0  . meloxicam (MOBIC) 15 MG tablet Take 1 tablet (15 mg total) by mouth daily. 30 tablet 2   Current Facility-Administered Medications on File Prior to Visit  Medication Dose Route Frequency Provider Last Rate Last Dose  . dexamethasone (DECADRON) injection 4 mg  4 mg Intra-articular Once Delories HeinzKathryn P Egerton, DPM      . triamcinolone acetonide (KENALOG) 10 MG/ML injection 10 mg  10 mg Other Once [employer] Thomasina Housley, DPM      . triamcinolone acetonide (KENALOG) 10 MG/ML injection 10 mg  10 mg Other Once [employer] Berkley Cronkright, DPM      . triamcinolone acetonide (KENALOG) 10 MG/ML injection 10 mg  10 mg Other Once Asencion Islamitorya Bowen Goyal, DPM       Allergies  Allergen Reactions  . Sulfa Antibiotics Rash   Objective:  General: Alert and oriented x3 in no acute distress  Dermatology: No open lesions bilateral lower extremities, no webspace macerations, no ecchymosis bilateral, all nails x 10 are well manicured.  Vascular: Dorsalis Pedis and Posterior Tibial pedal pulses 2/4, Capillary Fill Time 3 seconds,(+) pedal hair growth bilateral, + focal edema to the left lateral ankle, Temperature gradient within normal limits.  Neurology: Gross sensation intact via light touch bilateral, Protective sensation intact  with Semmes Weinstein  Monofilament to all pedal sites, Position sense intact, vibratory intact bilateral, Deep tendon reflexes within normal limits bilateral, No babinski sign present bilateral. (-)Tinels sign left foot.   Musculoskeletal: There is no tenderness with palpation at lateral ankle ligaments or sinus tarsi on Left, No pain with calf compression bilateral. Ankle, Subtalar joint, Midtarsal, MTPJ range of motion is within normal limits except with mild limitation on left, there is no 1st ray hypermobility noted bilateral, collapsing pes planus with impingement at ankle on weightbearing left>right.Strength within normal limits in all groups bilateral.  Assessment and Plan: Problem List Items Addressed This Visit    None    Visit Diagnoses    Ankle pain, left    -  Primary    Capsulitis of ankle, left        Arthritis          -Complete examination performed -Discussed continued care for STJ arthritis  - Cont with diclofenac to take as needed for pain and inflammation  -Cont with left compression stocking to assist with edema control -Recommend cont with tri-lock with long periods of standing and walking to protect ankle -Recommend cont with ice, elevate, and epsom salts as needed -Patient to return to office as needed or sooner if condition worsens.  Asencion Islamitorya Shadow Stiggers, DPM

## 2015-12-19 DIAGNOSIS — G8929 Other chronic pain: Secondary | ICD-10-CM | POA: Insufficient documentation

## 2015-12-19 DIAGNOSIS — M19072 Primary osteoarthritis, left ankle and foot: Secondary | ICD-10-CM | POA: Insufficient documentation

## 2015-12-19 DIAGNOSIS — M25572 Pain in left ankle and joints of left foot: Secondary | ICD-10-CM

## 2016-03-14 ENCOUNTER — Encounter: Payer: Self-pay | Admitting: Sports Medicine

## 2016-03-14 ENCOUNTER — Ambulatory Visit (INDEPENDENT_AMBULATORY_CARE_PROVIDER_SITE_OTHER): Payer: Managed Care, Other (non HMO) | Admitting: Sports Medicine

## 2016-03-14 DIAGNOSIS — G8929 Other chronic pain: Secondary | ICD-10-CM

## 2016-03-14 DIAGNOSIS — M25572 Pain in left ankle and joints of left foot: Secondary | ICD-10-CM

## 2016-03-14 DIAGNOSIS — M1990 Unspecified osteoarthritis, unspecified site: Secondary | ICD-10-CM

## 2016-03-14 DIAGNOSIS — M7752 Other enthesopathy of left foot: Secondary | ICD-10-CM

## 2016-03-14 MED ORDER — TRIAMCINOLONE ACETONIDE 40 MG/ML IJ SUSP: 20.0000 mg | Freq: Once | INTRAMUSCULAR | Status: DC

## 2016-03-14 NOTE — Progress Notes (Signed)
Patient ID: Tracy Frye, female   DOB: [birth year], 60 y.o.   MRN: [id number]  Subjective: Tracy CollumSharon M Frye is a 60 y.o. female patient who returns to office for evaluation of Left ankle pain; Reports that she still has some pain. Patient has been wearing trilock brace but sometimes when she removes her brace the pain is worse secondary to all the swelling that returns. Patient states that she does lots of standing for work and needs a work note. Reports that she still has Diclofenac and did not finish it because she does not like to take medication too long. Patient denies any other pedal complaints.   There are no active problems to display for this patient.  Current Outpatient Prescriptions on File Prior to Visit  Medication Sig Dispense Refill  . aspirin EC 81 MG tablet Take 81 mg by mouth.    . diclofenac (VOLTAREN) 75 MG EC tablet TAKE 1 TABLET BY MOUTH 2 (TWO) TIMES DAILY. 15 tablet 0  . meloxicam (MOBIC) 15 MG tablet Take 1 tablet (15 mg total) by mouth daily. 30 tablet 2   Current Facility-Administered Medications on File Prior to Visit  Medication Dose Route Frequency Provider Last Rate Last Dose  . dexamethasone (DECADRON) injection 4 mg  4 mg Intra-articular Once Delories HeinzKathryn P Egerton, DPM      . triamcinolone acetonide (KENALOG) 10 MG/ML injection 10 mg  10 mg Other Once [employer] Glendene Wyer, DPM      . triamcinolone acetonide (KENALOG) 10 MG/ML injection 10 mg  10 mg Other Once [employer] Toluwanimi Radebaugh, DPM      . triamcinolone acetonide (KENALOG) 10 MG/ML injection 10 mg  10 mg Other Once Asencion Islamitorya Roselina Burgueno, DPM       Allergies  Allergen Reactions  . Sulfa Antibiotics Rash   Objective:  General: Alert and oriented x3 in no acute distress  Dermatology: No open lesions bilateral lower extremities, no webspace macerations, no ecchymosis bilateral, all nails x 10 are well manicured.  Vascular: Dorsalis Pedis and Posterior Tibial pedal pulses 2/4, Capillary Fill Time 3 seconds,(+) pedal hair growth  bilateral, + focal edema to the left lateral ankle, Temperature gradient within normal limits.  Neurology: Gross sensation intact via light touch bilateral, Protective sensation intact with Semmes Weinstein Monofilament to all pedal sites, Position sense intact, vibratory intact bilateral, Deep tendon reflexes within normal limits bilateral, No babinski sign present bilateral. (-)Tinels sign left foot.   Musculoskeletal: There is tenderness with palpation at sinus tarsi on Left and mild tenderness to lateral ankle ligaments on left, No pain with calf compression bilateral. Ankle, Subtalar joint, Midtarsal, MTPJ range of motion is within normal limits except with mild limitation on left, there is no 1st ray hypermobility noted bilateral, collapsing pes planus with impingement at ankle on weightbearing left>right.Strength within normal limits in all groups bilateral.  Assessment and Plan: Problem List Items Addressed This Visit    None    Visit Diagnoses    Chronic pain of left ankle    -  Primary   Relevant Medications   triamcinolone acetonide (KENALOG-40) injection 20 mg (Start on 03/14/2016  2:30 PM)   Capsulitis of ankle, left       Relevant Medications   triamcinolone acetonide (KENALOG-40) injection 20 mg (Start on 03/14/2016  2:30 PM)   Arthritis       STJ, Left   Relevant Medications   triamcinolone acetonide (KENALOG-40) injection 20 mg (Start on 03/14/2016  2:30 PM)     -Complete examination  performed -Discussed continued care for STJ arthritis  -After oral consent and aseptic prep, injected a mixture containing 1 ml of 2%  plain lidocaine, 1 ml 0.5% plain marcaine, 0.5 ml of kenalog 40 and 0.5 ml of dexamethasone phosphate into left sinus tarsi without complication. Post-injection care discussed with patient.  -Rx topical pain cream from [facility] -Cont with diclofenac for pain and inflammation; patient to call if refill is needed -Cont with left compression stocking to assist  with edema control -Recommend cont with tri-lock with long periods of standing and walking to protect ankle -Recommend cont with ice, elevate, and epsom salts as needed -Patient to return to office as needed or sooner if condition worsens.  Asencion Islam, DPM

## 2016-03-15 ENCOUNTER — Telehealth: Payer: Self-pay | Admitting: *Deleted

## 2016-03-15 MED ORDER — NONFORMULARY OR COMPOUNDED ITEM: 2 refills | Status: DC

## 2016-03-15 NOTE — Telephone Encounter (Addendum)
-----   Message from Asencion Islamitorya [place]over, [place] sent at 03/14/2016  2:19 PM EDT ----- Regarding: [facility] cream Rx Topical pain cream for left ankle Thanks Dr Marylene LandStover 03/15/2016-[email or web address]

## 2016-07-27 ENCOUNTER — Telehealth: Payer: Self-pay | Admitting: *Deleted

## 2016-07-27 NOTE — Telephone Encounter (Signed)
Pt request Diclofenac. 07/27/2016-I informed pt, we had not seen her since 02/2016 and she would need an appt prior to future refills.

## 2016-08-21 ENCOUNTER — Ambulatory Visit (INDEPENDENT_AMBULATORY_CARE_PROVIDER_SITE_OTHER): Payer: Managed Care, Other (non HMO) | Admitting: Sports Medicine

## 2016-08-21 ENCOUNTER — Ambulatory Visit (INDEPENDENT_AMBULATORY_CARE_PROVIDER_SITE_OTHER): Payer: Managed Care, Other (non HMO)

## 2016-08-21 ENCOUNTER — Encounter: Payer: Self-pay | Admitting: Sports Medicine

## 2016-08-21 DIAGNOSIS — M1990 Unspecified osteoarthritis, unspecified site: Secondary | ICD-10-CM

## 2016-08-21 DIAGNOSIS — M7752 Other enthesopathy of left foot: Secondary | ICD-10-CM | POA: Diagnosis not present

## 2016-08-21 DIAGNOSIS — M25572 Pain in left ankle and joints of left foot: Secondary | ICD-10-CM | POA: Diagnosis not present

## 2016-08-21 DIAGNOSIS — R52 Pain, unspecified: Secondary | ICD-10-CM

## 2016-08-21 DIAGNOSIS — G8929 Other chronic pain: Secondary | ICD-10-CM

## 2016-08-21 MED ORDER — DICLOFENAC SODIUM 75 MG PO TBEC: DELAYED_RELEASE_TABLET | ORAL | 5 refills | Status: DC

## 2016-08-21 NOTE — Progress Notes (Signed)
Patient ID: Tracy Frye, female   DOB: [birth year], 61 y.o.   MRN: [id number]  Subjective: Tracy Frye is a 61 y.o. female patient who returns to office for follow up evaluation of Left ankle pain; Reports that she still has some flare ups but feels best with Diclofenac and needs refill. Patient denies any other pedal complaints.   There are no active problems to display for this patient.  Current Outpatient Prescriptions on File Prior to Visit  Medication Sig Dispense Refill  . aspirin EC 81 MG tablet Take 81 mg by mouth.    . meloxicam (MOBIC) 15 MG tablet Take 1 tablet (15 mg total) by mouth daily. 30 tablet 2  . NONFORMULARY OR COMPOUNDED ITEM [facility]:  Combination Pain Cream - Baclofen 2%, doxepin 5%, Gabapentin 6%, Topiramate 2%, :Pentoxifylline 3%, apply 1-2 grams to affected area 3-4 times daily. 120 each 2   Current Facility-Administered Medications on File Prior to Visit  Medication Dose Route Frequency Provider Last Rate Last Dose  . dexamethasone (DECADRON) injection 4 mg  4 mg Intra-articular Once Delories Heinz, DPM      . triamcinolone acetonide (KENALOG) 10 MG/ML injection 10 mg  10 mg Other Once [employer], DPM      . triamcinolone acetonide (KENALOG) 10 MG/ML injection 10 mg  10 mg Other Once [employer], DPM      . triamcinolone acetonide (KENALOG) 10 MG/ML injection 10 mg  10 mg Other Once [employer], DPM      . triamcinolone acetonide (KENALOG-40) injection 20 mg  20 mg Other Once Asencion Islam, DPM       Allergies  Allergen Reactions  . Sulfa Antibiotics Rash   Objective:  General: Alert and oriented x3 in no acute distress  Dermatology: No open lesions bilateral lower extremities, no webspace macerations, no ecchymosis bilateral, all nails x 10 are well manicured.  Vascular: Dorsalis Pedis and Posterior Tibial pedal pulses 2/4, Capillary Fill Time 3 seconds,(+) pedal hair growth bilateral, + mild focal edema to the left lateral ankle,  Temperature gradient within normal limits.  Neurology: Gross sensation intact via light touch bilateral, Protective sensation intact with Semmes Weinstein Monofilament to all pedal sites, Position sense intact, vibratory intact bilateral, Deep tendon reflexes within normal limits bilateral, No babinski sign present bilateral. (-)Tinels sign left foot.   Musculoskeletal: There is tenderness with palpation at sinus tarsi on Left and mild tenderness to lateral ankle ligaments on left, No pain with calf compression bilateral. Ankle, Subtalar joint, Midtarsal, MTPJ range of motion is within normal limits except with mild limitation on left, there is no 1st ray hypermobility noted bilateral, collapsing pes planus with impingement at ankle on weightbearing left>right.Strength within normal limits in all groups bilateral.  Xrays- no acute findings. Pes planus. STJ arthritis.   Assessment and Plan: Problem List Items Addressed This Visit    None    Visit Diagnoses    Pain    -  Primary   Relevant Orders   DG Ankle Complete Left (Completed)   Chronic pain of left ankle       Relevant Medications   diclofenac (VOLTAREN) 75 MG EC tablet   Capsulitis of ankle, left       Relevant Medications   diclofenac (VOLTAREN) 75 MG EC tablet   Arthritis       Relevant Medications   diclofenac (VOLTAREN) 75 MG EC tablet     -Complete examination performed -Xrays reviewed  -Discussed continued care  for STJ arthritis  -Continue topical pain cream from [facility] -Cont with diclofenac for pain and inflammation; refill provided  -Cont with left compression stocking to assist with edema control -Recommend cont with tri-lock with long periods of standing and walking to protect ankle -Recommend cont with ice, elevate, and epsom salts as needed -Patient to return to office as needed or sooner if condition worsens.  Asencion Islam, DPM

## 2016-11-09 ENCOUNTER — Encounter: Payer: Self-pay | Admitting: Sports Medicine

## 2016-11-09 ENCOUNTER — Ambulatory Visit (INDEPENDENT_AMBULATORY_CARE_PROVIDER_SITE_OTHER): Payer: Managed Care, Other (non HMO) | Admitting: Sports Medicine

## 2016-11-09 DIAGNOSIS — G8929 Other chronic pain: Secondary | ICD-10-CM

## 2016-11-09 DIAGNOSIS — M1990 Unspecified osteoarthritis, unspecified site: Secondary | ICD-10-CM

## 2016-11-09 DIAGNOSIS — M7752 Other enthesopathy of left foot: Secondary | ICD-10-CM | POA: Diagnosis not present

## 2016-11-09 DIAGNOSIS — M25572 Pain in left ankle and joints of left foot: Secondary | ICD-10-CM

## 2016-11-09 MED ORDER — TRIAMCINOLONE ACETONIDE 10 MG/ML IJ SUSP: 10.0000 mg | Freq: Once | INTRAMUSCULAR | Status: DC

## 2016-11-09 NOTE — Progress Notes (Signed)
Patient ID: Tracy Frye, female   DOB: [birth year], 61 y.o.   MRN: [id number]  Subjective: Tracy CollumSharon M Frye is a 61 y.o. female patient who returns to office for follow up evaluation of Left ankle pain; Reports that she wants an injection because she has been having pain in the left ankle. 2 weeks. Reports that she has been doing a lot of standing and walking with her grandkids for the summer and has experienced an increase in pain and request injection. Patient denies any other pedal complaints.   There are no active problems to display for this patient.  Current Outpatient Prescriptions on File Prior to Visit  Medication Sig Dispense Refill  . aspirin EC 81 MG tablet Take 81 mg by mouth.    . diclofenac (VOLTAREN) 75 MG EC tablet TAKE 1 TABLET BY MOUTH 2 (TWO) TIMES DAILY. 90 tablet 5  . meloxicam (MOBIC) 15 MG tablet Take 1 tablet (15 mg total) by mouth daily. 30 tablet 2  . NONFORMULARY OR COMPOUNDED ITEM [facility]:  Combination Pain Cream - Baclofen 2%, doxepin 5%, Gabapentin 6%, Topiramate 2%, :Pentoxifylline 3%, apply 1-2 grams to affected area 3-4 times daily. 120 each 2   Current Facility-Administered Medications on File Prior to Visit  Medication Dose Route Frequency Provider Last Rate Last Dose  . dexamethasone (DECADRON) injection 4 mg  4 mg Intra-articular Once Delories Heinzgerton, Kathryn P, DPM      . triamcinolone acetonide (KENALOG) 10 MG/ML injection 10 mg  10 mg Other Once Asencion IslamStover, Eustacio Ellen, DPM      . triamcinolone acetonide (KENALOG) 10 MG/ML injection 10 mg  10 mg Other Once Asencion IslamStover, Gianny Killman, DPM      . triamcinolone acetonide (KENALOG) 10 MG/ML injection 10 mg  10 mg Other Once GretnaStover, Maniyah Moller, DPM      . triamcinolone acetonide (KENALOG-40) injection 20 mg  20 mg Other Once Asencion IslamStover, Deangelo Berns, DPM       Allergies  Allergen Reactions  . Sulfa Antibiotics Rash   Objective:  General: Alert and oriented x3 in no acute distress  Dermatology: No open lesions bilateral lower  extremities, no webspace macerations, no ecchymosis bilateral, all nails x 10 are well manicured.  Vascular: Dorsalis Pedis and Posterior Tibial pedal pulses 2/4, Capillary Fill Time 3 seconds,(+) pedal hair growth bilateral, + mild focal edema to the left lateral ankle, There is also some swelling to the right ankle today, Temperature gradient within normal limits.  Neurology: Gross sensation intact via light touch bilateral, Protective sensation intact with Semmes Weinstein Monofilament to all pedal sites, Position sense intact, vibratory intact bilateral, Deep tendon reflexes within normal limits bilateral, No babinski sign present bilateral. (-)Tinels sign left foot.   Musculoskeletal: There is tenderness with palpation at sinus tarsi on Left and mild tenderness to lateral ankle ligaments on left, No pain with calf compression bilateral. Ankle, Subtalar joint, Midtarsal, MTPJ range of motion is within normal limits except with mild limitation on left, there is no 1st ray hypermobility noted bilateral, collapsing pes planus with impingement at ankle on weightbearing left>right.Strength within normal limits in all groups bilateral.  Assessment and Plan: Problem List Items Addressed This Visit    None    Visit Diagnoses    Capsulitis of ankle, left    -  Primary   Relevant Medications   triamcinolone acetonide (KENALOG) 10 MG/ML injection 10 mg (Start on 11/09/2016  5:30 PM)   Arthritis       Relevant Medications   triamcinolone acetonide (  KENALOG) 10 MG/ML injection 10 mg (Start on 11/09/2016  5:30 PM)   Chronic pain of left ankle       Relevant Medications   triamcinolone acetonide (KENALOG) 10 MG/ML injection 10 mg (Start on 11/09/2016  5:30 PM)     -Complete examination performed -Previous Xrays reviewed  -Discussed continued care for STJ arthritis  -After oral consent and aseptic prep, injected a mixture containing 1 ml of 2%  plain lidocaine, 1 ml 0.5% plain marcaine, 0.5 ml of  kenalog 10 and 0.5 ml of dexamethasone phosphate into left lateral ankle at sinus tarsi without complication. Post-injection care discussed with patient.  -Continue topical pain cream from [facility] -Cont with diclofenac for pain and inflammation -Cont with left compression stocking to assist with edema control -Recommend cont with tri-lock with long periods of standing and walking to protect ankle -Recommend cont with ice, elevate, and epsom salts as needed -Recommend patient to return to using her compression stockings on both sides, especially at end of day to assist with edema control. Also advised patient to watch her sodium intake and to elevate legs to assist with edema control. -Patient to return to office as needed or sooner if condition worsens.  Asencion Islam, DPM

## 2016-11-28 ENCOUNTER — Other Ambulatory Visit: Payer: Self-pay | Admitting: Family Medicine

## 2016-11-28 DIAGNOSIS — Z1231 Encounter for screening mammogram for malignant neoplasm of breast: Secondary | ICD-10-CM

## 2016-12-13 ENCOUNTER — Ambulatory Visit: Payer: Managed Care, Other (non HMO)

## 2017-01-15 ENCOUNTER — Encounter (INDEPENDENT_AMBULATORY_CARE_PROVIDER_SITE_OTHER): Payer: Self-pay | Admitting: Orthopaedic Surgery

## 2017-01-15 ENCOUNTER — Telehealth (INDEPENDENT_AMBULATORY_CARE_PROVIDER_SITE_OTHER): Payer: Self-pay | Admitting: Orthopaedic Surgery

## 2017-01-15 ENCOUNTER — Ambulatory Visit (INDEPENDENT_AMBULATORY_CARE_PROVIDER_SITE_OTHER): Payer: Managed Care, Other (non HMO) | Admitting: Orthopaedic Surgery

## 2017-01-15 ENCOUNTER — Ambulatory Visit (INDEPENDENT_AMBULATORY_CARE_PROVIDER_SITE_OTHER): Payer: Managed Care, Other (non HMO)

## 2017-01-15 DIAGNOSIS — G8929 Other chronic pain: Secondary | ICD-10-CM

## 2017-01-15 DIAGNOSIS — M545 Low back pain: Secondary | ICD-10-CM

## 2017-01-15 MED ORDER — PREDNISONE 10 MG (21) PO TBPK: ORAL_TABLET | ORAL | 0 refills | Status: DC

## 2017-01-15 MED ORDER — TRAMADOL HCL 50 MG PO TABS: 50.0000 mg | ORAL_TABLET | Freq: Four times a day (QID) | ORAL | 0 refills | Status: DC | PRN

## 2017-01-15 MED ORDER — METHOCARBAMOL 500 MG PO TABS: 500.0000 mg | ORAL_TABLET | Freq: Four times a day (QID) | ORAL | 2 refills | Status: DC | PRN

## 2017-01-15 NOTE — Telephone Encounter (Signed)
Tracy Frye with [facility] in [place] called advised they do not have the 500 mg methocarbamol tabs. She advised they do have the 750 mg tabs and asked if Dr Roda Shutters want to change the Rx to the 750 mg tabs. The number to contact Tracy Frye is [phone]

## 2017-01-15 NOTE — Telephone Encounter (Signed)
Called to advise on christy per Dr Roda Shutters

## 2017-01-15 NOTE — Progress Notes (Signed)
Office Visit Note   Patient: Tracy Frye           Date of Birth: 27-Nov-1955           MRN: [id number] Visit Date: 01/15/2017              Requested by: Clovis Riley, L.August Saucer, MD [address] [place], [place] [address] PCP: Clovis Riley, L.August Saucer, MD   Assessment & Plan: Visit Diagnoses:  1. Chronic bilateral low back pain, with sciatica presence unspecified     Plan: Patient is stable degenerative scoliosis. I prescribed prednisone, Robaxin, tramadol. Continue with swimming and TENS unit as needed. Questions encouraged and answered. Follow-up as needed.  Follow-Up Instructions: Return if symptoms worsen or fail to improve.   Orders:  Orders Placed This Encounter  Procedures  . XR Lumbar Spine 2-3 Views   Meds ordered this encounter  Medications  . predniSONE (STERAPRED UNI-PAK 21 TAB) 10 MG (21) TBPK tablet    Sig: Take as directed    Dispense:  21 tablet    Refill:  0  . methocarbamol (ROBAXIN) 500 MG tablet    Sig: Take 1 tablet (500 mg total) by mouth every 6 (six) hours as needed for muscle spasms.    Dispense:  30 tablet    Refill:  2  . traMADol (ULTRAM) 50 MG tablet    Sig: Take 1 tablet (50 mg total) by mouth every 6 (six) hours as needed.    Dispense:  30 tablet    Refill:  0      Procedures: No procedures performed   Clinical Data: No additional findings.   Subjective: Chief Complaint  Patient presents with  . Lower Back - Pain, Follow-up    Patient comes in today with low back pain and occasional radiation into the right leg but this has improved. She swims every day. She uses a TENS unit. Denies any trouble with balance or bowel or bladder dysfunction. She is having muscle spasms today.    Review of Systems  Constitutional: Negative.   HENT: Negative.   Eyes: Negative.   Respiratory: Negative.   Cardiovascular: Negative.   Endocrine: Negative.   Musculoskeletal: Negative.   Neurological: Negative.   Hematological: Negative.     Psychiatric/Behavioral: Negative.   All other systems reviewed and are negative.    Objective: Vital Signs: There were no vitals taken for this visit.  Physical Exam  Constitutional: She is oriented to person, place, and time. She appears well-developed and well-nourished.  Pulmonary/Chest: Effort normal.  Neurological: She is alert and oriented to person, place, and time.  Skin: Skin is warm. Capillary refill takes less than 2 seconds.  Psychiatric: She has a normal mood and affect. Her behavior is normal. Judgment and thought content normal.  Nursing note and vitals reviewed.   Ortho Exam Low back exam is stable. No focal motor sensory deficits. Specialty Comments:  No specialty comments available.  Imaging: Xr Lumbar Spine 2-3 Views  Result Date: 01/15/2017 Stable degenerative scoliosis    PMFS History: There are no active problems to display for this patient.  No past medical history on file.  No family history on file.  No past surgical history on file. Social History   Occupational History  . Not on file.   Social History Main Topics  . Smoking status: Never Smoker  . Smokeless tobacco: Never Used  . Alcohol use No  . Drug use: Unknown  . Sexual activity: Not on file

## 2017-01-15 NOTE — Telephone Encounter (Signed)
Please advise 

## 2017-01-15 NOTE — Telephone Encounter (Signed)
Yes 750 is fine

## 2017-02-19 ENCOUNTER — Ambulatory Visit (INDEPENDENT_AMBULATORY_CARE_PROVIDER_SITE_OTHER): Payer: Managed Care, Other (non HMO) | Admitting: Orthopaedic Surgery

## 2017-02-19 DIAGNOSIS — M545 Low back pain: Secondary | ICD-10-CM

## 2017-02-19 DIAGNOSIS — G8929 Other chronic pain: Secondary | ICD-10-CM

## 2017-02-19 MED ORDER — DIAZEPAM 5 MG PO TABS: 5.0000 mg | ORAL_TABLET | Freq: Four times a day (QID) | ORAL | 0 refills | Status: DC | PRN

## 2017-02-19 MED ORDER — HYDROCODONE-ACETAMINOPHEN 5-325 MG PO TABS: 1.0000 | ORAL_TABLET | Freq: Every evening | ORAL | 0 refills | Status: DC | PRN

## 2017-02-19 NOTE — Progress Notes (Signed)
   Office Visit Note   Patient: Tracy Frye           Date of Birth: Jan 28, 1956           MRN: [id number] Visit Date: 02/19/2017              Requested by: Clovis Riley, L.August Saucer, MD [address] [place], [place] [address] PCP: Clovis Riley, L.August Saucer, MD   Assessment & Plan: Visit Diagnoses:  1. Chronic bilateral low back pain, with sciatica presence unspecified     Plan: Prescription for Norco and volume. Given failure of conservative treatment recommend MRI of the lumbar spine. Follow-up after the MRI  Follow-Up Instructions: Return in about 2 weeks (around 03/05/2017).   Orders:  Orders Placed This Encounter  Procedures  . MR Lumbar Spine w/o contrast   Meds ordered this encounter  Medications  . diazepam (VALIUM) 5 MG tablet    Sig: Take 1-2 tablets (5-10 mg total) by mouth every 6 (six) hours as needed for muscle spasms.    Dispense:  60 tablet    Refill:  0  . HYDROcodone-acetaminophen (NORCO) 5-325 MG tablet    Sig: Take 1-2 tablets by mouth at bedtime as needed.    Dispense:  20 tablet    Refill:  0      Procedures: No procedures performed   Clinical Data: No additional findings.   Subjective: Chief Complaint  Patient presents with  . Lower Back - Pain, Follow-up    Tracy Frye follows up today for continued low back pain. She denies any radiation of pain. The pain is localized to her back. She continues to have muscle spasms. Tramadol Robaxin do not help. The pain is worse at night. She's been using a TENS unit without any significant relief.    Review of Systems  Constitutional: Negative.   HENT: Negative.   Eyes: Negative.   Respiratory: Negative.   Cardiovascular: Negative.   Endocrine: Negative.   Musculoskeletal: Negative.   Neurological: Negative.   Hematological: Negative.   Psychiatric/Behavioral: Negative.   All other systems reviewed and are negative.    Objective: Vital Signs: There were no vitals taken for this  visit.  Physical Exam  Constitutional: She is oriented to person, place, and time. She appears well-developed and well-nourished.  Pulmonary/Chest: Effort normal.  Neurological: She is alert and oriented to person, place, and time.  Skin: Skin is warm. Capillary refill takes less than 2 seconds.  Psychiatric: She has a normal mood and affect. Her behavior is normal. Judgment and thought content normal.  Nursing note and vitals reviewed.   Ortho Exam Lumbar spine exam shows tenderness along the lumbar musculature. No sciatic tension signs. Specialty Comments:  No specialty comments available.  Imaging: No results found.   PMFS History: There are no active problems to display for this patient.  No past medical history on file.  No family history on file.  No past surgical history on file. Social History   Occupational History  . Not on file.   Social History Main Topics  . Smoking status: Never Smoker  . Smokeless tobacco: Never Used  . Alcohol use No  . Drug use: Unknown  . Sexual activity: Not on file

## 2017-02-21 ENCOUNTER — Telehealth (INDEPENDENT_AMBULATORY_CARE_PROVIDER_SITE_OTHER): Payer: Self-pay | Admitting: Orthopaedic Surgery

## 2017-02-21 NOTE — Telephone Encounter (Signed)
Patient's husband called this morning stating that the medication she is on for her back is not working.  She  Is sleeping on the floor and is not able to walk or stand.  Patient's husband would like someone to call him.  CB#[phone].  Thank  You.

## 2017-02-21 NOTE — Telephone Encounter (Signed)
If she's in that much pain and norco is not helping then she should go to the ER for stronger pain meds

## 2017-02-21 NOTE — Telephone Encounter (Signed)
Patient request a copy of lumbar xrays from 01/15/17 on disc.

## 2017-02-21 NOTE — Telephone Encounter (Signed)
Called patient to advise and husband states he took her to see a chiropractor around 11 am and she is doing much better. I advised if she doesn't get better she can call us or go to the ED.

## 2017-02-21 NOTE — Telephone Encounter (Signed)
See message below °

## 2017-02-21 NOTE — Telephone Encounter (Signed)
DONE. LMOM that CD is ready for pick up. 

## 2017-02-26 ENCOUNTER — Ambulatory Visit (INDEPENDENT_AMBULATORY_CARE_PROVIDER_SITE_OTHER): Payer: Managed Care, Other (non HMO) | Admitting: Orthopaedic Surgery

## 2017-03-13 ENCOUNTER — Other Ambulatory Visit: Payer: Self-pay

## 2017-03-25 ENCOUNTER — Ambulatory Visit
Admission: RE | Admit: 2017-03-25 | Discharge: 2017-03-25 | Disposition: A | Discharge status: Home / Self Care | Payer: Managed Care, Other (non HMO) | Source: Ambulatory Visit | Attending: Orthopaedic Surgery | Admitting: Orthopaedic Surgery

## 2017-03-25 DIAGNOSIS — M545 Low back pain: Principal | ICD-10-CM

## 2017-03-25 DIAGNOSIS — G8929 Other chronic pain: Secondary | ICD-10-CM

## 2017-03-28 ENCOUNTER — Encounter (INDEPENDENT_AMBULATORY_CARE_PROVIDER_SITE_OTHER): Payer: Self-pay | Admitting: Orthopaedic Surgery

## 2017-03-28 ENCOUNTER — Ambulatory Visit (INDEPENDENT_AMBULATORY_CARE_PROVIDER_SITE_OTHER): Payer: Managed Care, Other (non HMO) | Admitting: Orthopaedic Surgery

## 2017-03-28 DIAGNOSIS — G8929 Other chronic pain: Secondary | ICD-10-CM | POA: Diagnosis not present

## 2017-03-28 DIAGNOSIS — M545 Low back pain: Secondary | ICD-10-CM | POA: Diagnosis not present

## 2017-03-28 NOTE — Progress Notes (Signed)
   Office Visit Note   Patient: Tracy Frye           Date of Birth: 1956-01-07           MRN: [id number] Visit Date: 03/28/2017              Requested by: Clovis RileyMitchell, L.August Saucerean, MD [address] [place], [place] [id number] PCP: Clovis RileyMitchell, L.August Saucerean, MD   Assessment & Plan: Visit Diagnoses:  1. Chronic bilateral low back pain, with sciatica presence unspecified     Plan: Lumbar spine MRI shows significant degenerative disc disease at L4-L5 with stenosis on the right side.  Referral to Dr. Alvester MorinNewton for epidural steroid injection.  Questions encouraged and answered.  Follow-up as needed.  Follow-Up Instructions: Return if symptoms worsen or fail to improve.   Orders:  Orders Placed This Encounter  Procedures  . Ambulatory referral to Physical Medicine Rehab   No orders of the defined types were placed in this encounter.     Procedures: No procedures performed   Clinical Data: No additional findings.   Subjective: Chief Complaint  Patient presents with  . Lower Back - Pain    Patient comes in today to review her lumbar spine MRI.  She continues to have right leg radiculopathy.    Review of Systems   Objective: Vital Signs: There were no vitals taken for this visit.  Physical Exam  Ortho Exam Exam is stable. Specialty Comments:  No specialty comments available.  Imaging: No results found.   PMFS History: There are no active problems to display for this patient.  History reviewed. No pertinent past medical history.  History reviewed. No pertinent family history.  History reviewed. No pertinent surgical history. Social History   Occupational History  . Not on file  Tobacco Use  . Smoking status: Never Smoker  . Smokeless tobacco: Never Used  Substance and Sexual Activity  . Alcohol use: No  . Drug use: Not on file  . Sexual activity: Not on file

## 2017-04-02 ENCOUNTER — Telehealth (INDEPENDENT_AMBULATORY_CARE_PROVIDER_SITE_OTHER): Payer: Self-pay | Admitting: Orthopaedic Surgery

## 2017-04-02 NOTE — Telephone Encounter (Signed)
Tramadol #30

## 2017-04-02 NOTE — Telephone Encounter (Signed)
See message below  Please advise

## 2017-04-03 MED ORDER — TRAMADOL HCL 50 MG PO TABS: 50.0000 mg | ORAL_TABLET | Freq: Two times a day (BID) | ORAL | 0 refills | Status: DC | PRN

## 2017-04-03 NOTE — Telephone Encounter (Signed)
Called Rx for Tramadol into pharm. Patient aware.

## 2017-04-03 NOTE — Addendum Note (Signed)
Addended by: Albertina ParrGARCIA, Daxtin Leiker on: 04/03/2017 01:55 PM   Modules accepted: Orders

## 2017-04-03 NOTE — Telephone Encounter (Signed)
Patient would also like a Rf on Robaxin.  [facility]

## 2017-04-03 NOTE — Telephone Encounter (Signed)
yes

## 2017-04-04 MED ORDER — METHOCARBAMOL 500 MG PO TABS: 500.0000 mg | ORAL_TABLET | Freq: Four times a day (QID) | ORAL | 2 refills | Status: DC | PRN

## 2017-04-04 NOTE — Telephone Encounter (Signed)
Faxed robaxin into pharm

## 2017-04-04 NOTE — Addendum Note (Signed)
Addended by: Albertina ParrGARCIA, Regenia Erck on: 04/04/2017 08:29 AM   Modules accepted: Orders

## 2017-04-10 ENCOUNTER — Telehealth (INDEPENDENT_AMBULATORY_CARE_PROVIDER_SITE_OTHER): Payer: Self-pay | Admitting: Orthopaedic Surgery

## 2017-04-10 NOTE — Telephone Encounter (Signed)
error 

## 2017-04-15 ENCOUNTER — Ambulatory Visit (INDEPENDENT_AMBULATORY_CARE_PROVIDER_SITE_OTHER): Payer: Managed Care, Other (non HMO)

## 2017-04-15 ENCOUNTER — Ambulatory Visit (INDEPENDENT_AMBULATORY_CARE_PROVIDER_SITE_OTHER): Payer: Managed Care, Other (non HMO) | Admitting: Physical Medicine and Rehabilitation

## 2017-04-15 VITALS — BP 133/84 | HR 66 | Temp 98.4°F

## 2017-04-15 DIAGNOSIS — M5136 Other intervertebral disc degeneration, lumbar region: Secondary | ICD-10-CM | POA: Diagnosis not present

## 2017-04-15 DIAGNOSIS — M47816 Spondylosis without myelopathy or radiculopathy, lumbar region: Secondary | ICD-10-CM

## 2017-04-15 DIAGNOSIS — M419 Scoliosis, unspecified: Secondary | ICD-10-CM

## 2017-04-15 DIAGNOSIS — M5116 Intervertebral disc disorders with radiculopathy, lumbar region: Secondary | ICD-10-CM

## 2017-04-15 MED ORDER — BETAMETHASONE SOD PHOS & ACET 6 (3-3) MG/ML IJ SUSP
12.0000 mg | Freq: Once | INTRAMUSCULAR | Status: AC
  Administered 2017-04-15: 12 mg

## 2017-04-15 MED ORDER — LIDOCAINE HCL (PF) 1 % IJ SOLN
2.0000 mL | Freq: Once | INTRAMUSCULAR | Status: AC
  Administered 2017-04-15: 2 mL

## 2017-04-15 NOTE — Progress Notes (Deleted)
Back went out all of a sudden, hasn't done it in years, had pred pak, MRI and better during day but worse at night, spasms: + Dirver, Husband Richard [place]umber[place], -BTs, -Dye allergy

## 2017-04-15 NOTE — Patient Instructions (Signed)

## 2017-04-16 ENCOUNTER — Encounter (INDEPENDENT_AMBULATORY_CARE_PROVIDER_SITE_OTHER): Payer: Self-pay | Admitting: Physical Medicine and Rehabilitation

## 2017-04-16 NOTE — Progress Notes (Signed)
Tracy CollumSharon M Frye - 61 y.o. female MRN [id number]  Date of birth: 31-Mar-1956  Office Visit Note: Visit Date: 04/15/2017 PCP: Clovis RileyMitchell, L.August Saucerean, MD Referred by: Clovis RileyMitchell, L.August Saucerean, MD  Subjective: Chief Complaint  Patient presents with  . Lower Back - Pain  . Right Hip - Pain  . Right Leg - Pain   HPI: Tracy Frye is a very pleasant 61 year old female who has been followed by Dr. Roda ShuttersXu in our office for her low back and hip pain.  Dr. Roda ShuttersXu did order an MRI of the lumbar spine which is reviewed below.  Tracy Frye reports several months ago that her back "went out all of the sudden ".  She reports that this has not happened in many years.  She has been to the chiropractor as well as having a prednisone Dosepak and medications which have not really helped that much.  She is better during the day and can move and walk but it is worse at night and she is having significant spasms.  She reports some pain radiating from the right lateral buttock into the right thigh and a somewhat L5 distribution.  She denies any focal weakness or paresthesias.  She continues to try to do some yoga which she does quite frequently.  She also tries to stretch.  She is an avid swimmer but does not swim in the winter.  She has had no history of lumbar surgery.  She reports that in her 5220s she had what was referred to as a "slipped disc".  At that time she was hospitalized but eventually the pain did go away and she has had no real issues with it except for time to time having her back will flareup.  She has not had any fevers chills or night sweats.  She has had no unexplained weight loss.  Pain is worse with prolonged sitting and prolonged standing.  X-ray showed some scoliosis as well as degenerative change.  MRI is again reviewed below.    Review of Systems  Constitutional: Negative for chills, fever, malaise/fatigue and weight loss.  HENT: Negative for hearing loss and sinus pain.   Eyes: Negative for blurred vision, double vision  and photophobia.  Respiratory: Negative for cough and shortness of breath.   Cardiovascular: Negative for chest pain, palpitations and leg swelling.  Gastrointestinal: Negative for abdominal pain, nausea and vomiting.  Genitourinary: Negative for flank pain.  Musculoskeletal: Positive for back pain. Negative for myalgias.       Hip and leg pain  Skin: Negative for itching and rash.  Neurological: Negative for tremors, focal weakness and weakness.  Endo/Heme/Allergies: Negative.   Psychiatric/Behavioral: Negative for depression.  All other systems reviewed and are negative.  Otherwise per HPI.  Assessment & Plan: Visit Diagnoses:  1. Spondylosis without myelopathy or radiculopathy, lumbar region   2. Radiculopathy due to lumbar intervertebral disc disorder   3. Degenerative disc disease, lumbar   4. Scoliosis of lumbar spine, unspecified scoliosis type     Plan: Findings:  Chronic worsening now severe right-sided low back pain referring into the right lateral buttock and hip into the leg and is somewhat L5 distribution.  MRI evidence of significant disc height loss at L4-5 with right lateral recess narrowing due to facet arthropathy and the scoliosis.  He does not have any severe nerve compression or severe stenosis.  I do think her symptoms are coming from this level.  We discussed the natural history of disc herniations and degenerative disc disease.  We talked about this not being a disease necessarily but it can lessen the space where the nerves are.  We talked about the use of injections in their ability to hopefully get the nerve to calm down.  We talked about surgery.  We talked about yoga and activity modification.  Talked about core strengthening.  At this point she has failed conservative care and is getting pretty severe right low back and hip and leg pain which I think is radicular.  I think she is having some pain that is also facet mediated pain on the right.  At this point due to  the severity the symptoms are going to complete a right L5-S1 interlaminar epidural steroid injection.  Depending on the results of that would look at either facet joint block or transforaminal approach at regrouping potentially with a physical therapist.  Otherwise her medications should stay the same but this is also something could be looked at.    Meds & Orders:  Meds ordered this encounter  Medications  . lidocaine (PF) (XYLOCAINE) 1 % injection 2 mL  . betamethasone acetate-betamethasone sodium phosphate (CELESTONE) injection 12 mg    Orders Placed This Encounter  Procedures  . Facet Injection  . XR C-ARM NO REPORT    Follow-up: Return if symptoms worsen or fail to improve.   Procedures: No procedures performed  Lumbar Epidural Steroid Injection - Interlaminar Approach with Fluoroscopic Guidance  Patient: Tracy Frye      Date of Birth: 08/17/1955 MRN: [id number] PCP: Clovis Riley, L.August Saucer, MD      Visit Date: 04/15/2017   Universal Protocol:     Consent Given By: the patient  Position: PRONE  Additional Comments: Vital signs were monitored before and after the procedure. Patient was prepped and draped in the usual sterile fashion. The correct patient, procedure, and site was verified.   Injection Procedure Details:  Procedure Site One Meds Administered:  Meds ordered this encounter  Medications  . lidocaine (PF) (XYLOCAINE) 1 % injection 2 mL  . betamethasone acetate-betamethasone sodium phosphate (CELESTONE) injection 12 mg     Laterality: Right  Location/Site:  L5-S1  Needle size: 20 G  Needle type: Tuohy  Needle Placement: Paramedian epidural  Findings:  -Contrast Used: 0.5 mL iohexol 180 mg iodine/mL   -Comments: Excellent flow of contrast into the epidural space.  Procedure Details: Using a paramedian approach from the side mentioned above, the region overlying the inferior lamina was localized under fluoroscopic visualization and the soft tissues  overlying this structure were infiltrated with 4 ml. of 1% Lidocaine without Epinephrine. The Tuohy needle was inserted into the epidural space using a paramedian approach.   The epidural space was localized using loss of resistance along with lateral and bi-planar fluoroscopic views.  After negative aspirate for air, blood, and CSF, a 2 ml. volume of Isovue-250 was injected into the epidural space and the flow of contrast was observed. Radiographs were obtained for documentation purposes.    The injectate was administered into the level noted above.   Additional Comments:  The patient tolerated the procedure well Dressing: Band-Aid    Post-procedure details: Patient was observed during the procedure. Post-procedure instructions were reviewed.  Patient left the clinic in stable condition.    Clinical History: MRI LUMBAR SPINE WITHOUT CONTRAST  TECHNIQUE: Multiplanar, multisequence MR imaging of the lumbar spine was performed. No intravenous contrast was administered.  COMPARISON:  Lumbar radiographs 01/15/2017.  FINDINGS: Segmentation: Normal lumbar segmentation demonstrated on the comparison radiographs.  Alignment: Moderate dextroconvex lumbar scoliosis. Straightening of lumbar lordosis. Mild retrolisthesis at L4-L5 and L5-S1.  Vertebrae: Chronic degenerative endplate marrow signal changes at L4-L5. Background bone marrow signal is within normal limits. No marrow edema or evidence of acute osseous abnormality. Intact visible sacrum and SI joints.  Conus medullaris: Extends to the L1 level and appears normal.  Paraspinal and other soft tissues: Negative.  Disc levels:  T10-T11:  Partially visible mild disc bulge.  T11-12: Mild circumferential disc bulge. Mild to moderate facet hypertrophy. Mild left and moderate to severe right T11 foraminal stenosis.  T12-L1:  Negative.  L1-L2: Mild circumferential disc bulge with broad-based posterior component.  Mild facet hypertrophy. Mild bilateral L1 foraminal stenosis.  L2-L3: Mild circumferential disc bulge. Mild facet hypertrophy. No stenosis.  L3-L4: Mild facet and ligament flavum hypertrophy greater on the right. Borderline to mild left L3 foraminal stenosis.  L4-L5: Severe disc space loss. Circumferential disc osteophyte complex with broad-based posterior component. Mild to moderate facet and ligament flavum hypertrophy. Borderline to mild spinal stenosis. Mild to moderate right lateral recess and right foraminal stenosis. Mild left foraminal stenosis.  L5-S1: Disc space loss. Mostly far lateral disc osteophyte complex. No spinal or lateral recess stenosis. Borderline to mild L5 bilateral foraminal stenosis.  IMPRESSION: 1. Dextroconvex lumbar scoliosis with straightening of lordosis and mild retrolisthesis at L4-L5 and L5-S1. 2. Advanced chronic L4-L5 and L5-S1 disc degeneration with endplate spurring. Up to mild spinal stenosis at L4-L5 associated with up to moderate right neural foraminal and right lateral recess stenosis at the right L4 and L5 nerve levels, respectively. 3. Only mild neural foraminal stenosis at L5-S1. Intermittent mild lumbar foraminal stenosis elsewhere. 4. T11-T12 disc and advanced posterior element degeneration with up to severe right T11 foraminal stenosis.   Electronically Signed   By: Odessa Fleming M.D.   On: 03/25/2017 08:24  She reports that  has never smoked. she has never used smokeless tobacco. No results for input(s): HGBA1C, LABURIC in the last 8760 hours.  Objective:  VS:  HT:    WT:   BMI:     BP:133/84  HR:66bpm  TEMP:98.4 F (36.9 C)( )  RESP:98 % Physical Exam  Constitutional: She is oriented to person, place, and time. She appears well-developed and well-nourished.  Eyes: Conjunctivae and EOM are normal. Pupils are equal, round, and reactive to light.  Cardiovascular: Normal rate and intact distal pulses.  Pulmonary/Chest:  Effort normal.  Musculoskeletal:  The patient arises from a seated position fairly slowly.  She does have pain with extension rotation of the lumbar spine to the right.  No real pain over the greater trochanter.  No pain with hip rotation.  She has an equivocally positive slump test on the right.  She has good distal strength without clonus.  Neurological: She is alert and oriented to person, place, and time. She exhibits normal muscle tone. Coordination normal.  Skin: Skin is warm and dry. No rash noted. No erythema.  Psychiatric: She has a normal mood and affect. Her behavior is normal.  Nursing note and vitals reviewed.   Ortho Exam Imaging: Xr C-arm No Report  Result Date: 04/15/2017 Please see Notes or Procedures tab for imaging impression.   Past Medical/Family/Surgical/Social History: Medications & Allergies reviewed per EMR There are no active problems to display for this patient.  History reviewed. No pertinent past medical history. History reviewed. No pertinent family history. History reviewed. No pertinent surgical history. Social History   Occupational History  . Not  on file  Tobacco Use  . Smoking status: Never Smoker  . Smokeless tobacco: Never Used  Substance and Sexual Activity  . Alcohol use: No  . Drug use: Not on file  . Sexual activity: Not on file

## 2017-04-16 NOTE — Procedures (Signed)
Lumbar Epidural Steroid Injection - Interlaminar Approach with Fluoroscopic Guidance  Patient: Tracy Frye      Date of Birth: [birth year] MRN: [id number] PCP: Clovis RileyMitchell, L.August Saucerean, MD      Visit Date: 04/15/2017   Universal Protocol:     Consent Given By: the patient  Position: PRONE  Additional Comments: Vital signs were monitored before and after the procedure. Patient was prepped and draped in the usual sterile fashion. The correct patient, procedure, and site was verified.   Injection Procedure Details:  Procedure Site One Meds Administered:  Meds ordered this encounter  Medications  . lidocaine (PF) (XYLOCAINE) 1 % injection 2 mL  . betamethasone acetate-betamethasone sodium phosphate (CELESTONE) injection 12 mg     Laterality: Right  Location/Site:  L5-S1  Needle size: 20 G  Needle type: Tuohy  Needle Placement: Paramedian epidural  Findings:  -Contrast Used: 0.5 mL iohexol 180 mg iodine/mL   -Comments: Excellent flow of contrast into the epidural space.  Procedure Details: Using a paramedian approach from the side mentioned above, the region overlying the inferior lamina was localized under fluoroscopic visualization and the soft tissues overlying this structure were infiltrated with 4 ml. of 1% Lidocaine without Epinephrine. The Tuohy needle was inserted into the epidural space using a paramedian approach.   The epidural space was localized using loss of resistance along with lateral and bi-planar fluoroscopic views.  After negative aspirate for air, blood, and CSF, a 2 ml. volume of Isovue-250 was injected into the epidural space and the flow of contrast was observed. Radiographs were obtained for documentation purposes.    The injectate was administered into the level noted above.   Additional Comments:  The patient tolerated the procedure well Dressing: Band-Aid    Post-procedure details: Patient was observed during the procedure. Post-procedure  instructions were reviewed.  Patient left the clinic in stable condition.

## 2017-04-23 ENCOUNTER — Telehealth (INDEPENDENT_AMBULATORY_CARE_PROVIDER_SITE_OTHER): Payer: Self-pay | Admitting: Physical Medicine and Rehabilitation

## 2017-04-23 NOTE — Telephone Encounter (Signed)
"  [email or web address]" or [facility] or [facility] on [address] up from our office

## 2017-04-24 NOTE — Telephone Encounter (Signed)
Called patient to advise  °

## 2017-05-10 ENCOUNTER — Encounter: Payer: Self-pay | Admitting: Sports Medicine

## 2017-05-10 ENCOUNTER — Ambulatory Visit: Payer: Managed Care, Other (non HMO) | Admitting: Sports Medicine

## 2017-05-10 DIAGNOSIS — M1990 Unspecified osteoarthritis, unspecified site: Secondary | ICD-10-CM

## 2017-05-10 DIAGNOSIS — M25572 Pain in left ankle and joints of left foot: Secondary | ICD-10-CM

## 2017-05-10 DIAGNOSIS — G8929 Other chronic pain: Secondary | ICD-10-CM

## 2017-05-10 DIAGNOSIS — M7752 Other enthesopathy of left foot: Secondary | ICD-10-CM | POA: Diagnosis not present

## 2017-05-10 MED ORDER — TRIAMCINOLONE ACETONIDE 40 MG/ML IJ SUSP: 20.0000 mg | Freq: Once | INTRAMUSCULAR | Status: DC

## 2017-05-10 NOTE — Progress Notes (Signed)
Patient ID: Tracy Frye, female   DOB: 08-Nov-1955, 61 y.o.   MRN: [id number]  Subjective: Tracy Frye is a 61 y.o. female patient who returns to office for follow up evaluation of Left ankle pain; Reports that she wants an injection recurrent pain since 12/10, snow after doing 12 hours of walking and standing [place] [employer] [place]. Patient denies any other pedal complaints.   There are no active problems to display for this patient.  Current Outpatient Medications on File Prior to Visit  Medication Sig Dispense Refill  . aspirin EC 81 MG tablet Take 81 mg by mouth.    . diazepam (VALIUM) 5 MG tablet Take 1-2 tablets (5-10 mg total) by mouth every 6 (six) hours as needed for muscle spasms. 60 tablet 0  . diclofenac (VOLTAREN) 75 MG EC tablet TAKE 1 TABLET BY MOUTH 2 (TWO) TIMES DAILY. 90 tablet 5  . HYDROcodone-acetaminophen (NORCO) 5-325 MG tablet Take 1-2 tablets by mouth at bedtime as needed. 20 tablet 0  . meloxicam (MOBIC) 15 MG tablet Take 1 tablet (15 mg total) by mouth daily. 30 tablet 2  . methocarbamol (ROBAXIN) 500 MG tablet Take 1 tablet (500 mg total) every 6 (six) hours as needed by mouth for muscle spasms. 30 tablet 2  . NONFORMULARY OR COMPOUNDED ITEM [facility]:  Combination Pain Cream - Baclofen 2%, doxepin 5%, Gabapentin 6%, Topiramate 2%, :Pentoxifylline 3%, apply 1-2 grams to affected area 3-4 times daily. 120 each 2  . predniSONE (STERAPRED UNI-PAK 21 TAB) 10 MG (21) TBPK tablet Take as directed 21 tablet 0  . traMADol (ULTRAM) 50 MG tablet Take 1 tablet (50 mg total) 2 (two) times daily as needed by mouth. 30 tablet 0   Current Facility-Administered Medications on File Prior to Visit  Medication Dose Route Frequency Provider Last Rate Last Dose  . dexamethasone (DECADRON) injection 4 mg  4 mg Intra-articular Once Delories Heinzgerton, Kathryn P, DPM      . triamcinolone acetonide (KENALOG) 10 MG/ML injection 10 mg  10 mg Other Once Asencion IslamStover, Dylanie Quesenberry, DPM      . triamcinolone  acetonide (KENALOG) 10 MG/ML injection 10 mg  10 mg Other Once Asencion IslamStover, Iolanda Folson, DPM      . triamcinolone acetonide (KENALOG) 10 MG/ML injection 10 mg  10 mg Other Once Asencion IslamStover, Herma Uballe, DPM      . triamcinolone acetonide (KENALOG) 10 MG/ML injection 10 mg  10 mg Other Once RacineStover, Tyrie Porzio, DPM      . triamcinolone acetonide (KENALOG-40) injection 20 mg  20 mg Other Once Asencion IslamStover, Gerry Blanchfield, DPM       Allergies  Allergen Reactions  . Sulfa Antibiotics Rash   Objective:  General: Alert and oriented x3 in no acute distress  Dermatology: No open lesions bilateral lower extremities, no webspace macerations, no ecchymosis bilateral, all nails x 10 are well manicured.  Vascular: Dorsalis Pedis and Posterior Tibial pedal pulses 2/4, Capillary Fill Time 3 seconds,(+) pedal hair growth bilateral, + mild focal edema to the left lateral ankle, There is also some swelling to the right ankle today, Temperature gradient within normal limits.  Neurology: Gross sensation intact via light touch bilateral, Protective sensation intact with Semmes Weinstein Monofilament to all pedal sites, Position sense intact, vibratory intact bilateral, Deep tendon reflexes within normal limits bilateral, No babinski sign present bilateral. (-)Tinels sign left foot.   Musculoskeletal: There is tenderness with palpation at sinus tarsi on Left and mild tenderness to lateral ankle ligaments on left, No pain  with calf compression bilateral. Ankle, Subtalar joint, Midtarsal, MTPJ range of motion is within normal limits except with mild limitation on left, there is no 1st ray hypermobility noted bilateral, collapsing pes planus with impingement at ankle on weightbearing left>right.Strength within normal limits in all groups bilateral.  Assessment and Plan: Problem List Items Addressed This Visit    None    Visit Diagnoses    Capsulitis of ankle, left    -  Primary   Relevant Medications   triamcinolone acetonide (KENALOG-40) injection  20 mg (Start on 05/10/2017 11:30 AM)   Arthritis       Relevant Medications   triamcinolone acetonide (KENALOG-40) injection 20 mg (Start on 05/10/2017 11:30 AM)   Chronic pain of left ankle       Relevant Medications   triamcinolone acetonide (KENALOG-40) injection 20 mg (Start on 05/10/2017 11:30 AM)     -Complete examination performed -Previous Xrays reviewed  -Discussed continued care for STJ arthritis  -After oral consent and aseptic prep, injected a mixture containing 1 ml of 2%  plain lidocaine, 1 ml 0.5% plain marcaine, 0.5 ml of kenalog 40 and 0.5 ml of dexamethasone phosphate into left lateral ankle at sinus tarsi without complication. Post-injection care discussed with patient.  -Rx Ankle stabilizing brace -Continue topical pain cream from [facility] -Cont with diclofenac for pain and inflammation -Cont with left compression stocking to assist with edema control -Recommend cont with ice, elevate, and epsom salts as needed -Patient to return to office as needed or sooner if condition worsens.  Asencion Islamitorya Dealva Lafoy, DPM

## 2017-05-21 HISTORY — PX: FOOT SURGERY: SHX648

## 2017-07-11 ENCOUNTER — Telehealth (INDEPENDENT_AMBULATORY_CARE_PROVIDER_SITE_OTHER): Payer: Self-pay | Admitting: Physical Medicine and Rehabilitation

## 2017-07-11 ENCOUNTER — Other Ambulatory Visit (INDEPENDENT_AMBULATORY_CARE_PROVIDER_SITE_OTHER): Payer: Self-pay | Admitting: Physical Medicine and Rehabilitation

## 2017-07-11 MED ORDER — METHOCARBAMOL 500 MG PO TABS: 500.0000 mg | ORAL_TABLET | Freq: Four times a day (QID) | ORAL | 2 refills | Status: DC | PRN

## 2017-07-11 MED ORDER — TRAMADOL HCL 50 MG PO TABS: 50.0000 mg | ORAL_TABLET | Freq: Two times a day (BID) | ORAL | 0 refills | Status: DC | PRN

## 2017-07-11 NOTE — Progress Notes (Signed)
Robaxin and tramadol ordered for pain until injection.

## 2017-07-11 NOTE — Telephone Encounter (Signed)
Sent in methacarbamol and tramadol, tramadol did not print, if the [facility] did not receive then use my order to fax or call in. If she feels she needs something stronger she can follow with Dr. Roda ShuttersXu

## 2017-07-11 NOTE — Telephone Encounter (Signed)
Patient is scheduled for 07/24/17. She would like something for pain as well as muscle relaxers to take until then. Please advise. [facility].

## 2017-07-11 NOTE — Telephone Encounter (Signed)
ok 

## 2017-07-12 NOTE — Telephone Encounter (Signed)
Called in to patient's pharmacy. 

## 2017-07-24 ENCOUNTER — Ambulatory Visit (INDEPENDENT_AMBULATORY_CARE_PROVIDER_SITE_OTHER): Payer: Managed Care, Other (non HMO)

## 2017-07-24 ENCOUNTER — Ambulatory Visit (INDEPENDENT_AMBULATORY_CARE_PROVIDER_SITE_OTHER): Payer: Managed Care, Other (non HMO) | Admitting: Physical Medicine and Rehabilitation

## 2017-07-24 ENCOUNTER — Encounter (INDEPENDENT_AMBULATORY_CARE_PROVIDER_SITE_OTHER): Payer: Self-pay | Admitting: Physical Medicine and Rehabilitation

## 2017-07-24 VITALS — BP 121/74 | HR 70 | Temp 98.1°F

## 2017-07-24 DIAGNOSIS — M5416 Radiculopathy, lumbar region: Secondary | ICD-10-CM | POA: Diagnosis not present

## 2017-07-24 MED ORDER — METHYLPREDNISOLONE ACETATE 80 MG/ML IJ SUSP
80.0000 mg | Freq: Once | INTRAMUSCULAR | Status: AC
  Administered 2017-07-24: 80 mg

## 2017-07-24 NOTE — Patient Instructions (Signed)

## 2017-07-24 NOTE — Progress Notes (Signed)
 .  Numeric Pain Rating Scale and Functional Assessment Average Pain 7   In the last MONTH (on 0-10 scale) has pain interfered with the following?  1. General activity like being  able to carry out your everyday physical activities such as walking, climbing stairs, carrying groceries, or moving a chair?  Rating(6)   +Driver, -BT, -Dye Allergies.  

## 2017-08-02 NOTE — Procedures (Signed)
Lumbar Epidural Steroid Injection - Interlaminar Approach with Fluoroscopic Guidance  Patient: Tracy CollumSharon M Frye      Date of Birth: 08-Apr-1956 MRN: [id number] PCP: Clovis RileyMitchell, L.August Saucerean, MD      Visit Date: 07/24/2017   Universal Protocol:     Consent Given By: the patient  Position: PRONE  Additional Comments: Vital signs were monitored before and after the procedure. Patient was prepped and draped in the usual sterile fashion. The correct patient, procedure, and site was verified.   Injection Procedure Details:  Procedure Site One Meds Administered:  Meds ordered this encounter  Medications  . methylPREDNISolone acetate (DEPO-MEDROL) injection 80 mg     Laterality: Right  Location/Site:  L4-L5  Needle size: 20 G  Needle type: Tuohy  Needle Placement: Paramedian epidural  Findings:   -Comments: Excellent flow of contrast into the epidural space.  Procedure Details: Using a paramedian approach from the side mentioned above, the region overlying the inferior lamina was localized under fluoroscopic visualization and the soft tissues overlying this structure were infiltrated with 4 ml. of 1% Lidocaine without Epinephrine. The Tuohy needle was inserted into the epidural space using a paramedian approach.   The epidural space was localized using loss of resistance along with lateral and bi-planar fluoroscopic views.  After negative aspirate for air, blood, and CSF, a 2 ml. volume of Isovue-250 was injected into the epidural space and the flow of contrast was observed. Radiographs were obtained for documentation purposes.    The injectate was administered into the level noted above.   Additional Comments:  The patient tolerated the procedure well Dressing: Band-Aid    Post-procedure details: Patient was observed during the procedure. Post-procedure instructions were reviewed.  Patient left the clinic in stable condition.

## 2017-08-02 NOTE — Progress Notes (Signed)
Tracy MALSON - 62 y.o. female MRN [id number]  Date of birth: 1956/05/14  Office Visit Note: Visit Date: 07/24/2017 PCP: Tracy Frye, L.Tracy Saucer, MD Referred by: Tracy Frye, L.Tracy Saucer, MD  Subjective: Chief Complaint  Patient presents with  . Lower Back - Pain   HPI: Tracy Frye is a 62 year old female that we saw back in November.  She follows with Dr. Roda Frye for orthopedic care in our office.  She reports prior epidural injection Frye the right at L4-5 was very beneficial and really her pain started up 3-4 weeks ago in the same area and position.  She continues to do home stretches and she did see Tracy Frye chiropractic likely suggested and she is done well with them.  We are going to repeat a right L4-5 interlaminar epidural injection.  Would consider facet joint injection in the future.  The injection  will be diagnostic and hopefully therapeutic. The patient has failed conservative care including time, medications and activity modification.    ROS Otherwise per HPI.  Assessment & Plan: Visit Diagnoses:  1. Lumbar radiculopathy     Plan: No additional findings.   Meds & Orders:  Meds ordered this encounter  Medications  . methylPREDNISolone acetate (DEPO-MEDROL) injection 80 mg    Orders Placed This Encounter  Procedures  . XR C-ARM NO REPORT  . Epidural Steroid injection    Follow-up: Return if symptoms worsen or fail to improve, for Consider facet blocks.   Procedures: No procedures performed  Lumbar Epidural Steroid Injection - Interlaminar Approach with Fluoroscopic Guidance  Patient: Tracy Frye      Date of Birth: September 14, 1955 MRN: [id number] PCP: Tracy Frye, L.Tracy Saucer, MD      Visit Date: 07/24/2017   Universal Protocol:     Consent Given By: the patient  Position: PRONE  Additional Comments: Vital signs were monitored before and after the procedure. Patient was prepped and draped in the usual sterile fashion. The correct patient, procedure, and site was  verified.   Injection Procedure Details:  Procedure Site One Meds Administered:  Meds ordered this encounter  Medications  . methylPREDNISolone acetate (DEPO-MEDROL) injection 80 mg     Laterality: Right  Location/Site:  L4-L5  Needle size: 20 G  Needle type: Tuohy  Needle Placement: Paramedian epidural  Findings:   -Comments: Excellent flow of contrast into the epidural space.  Procedure Details: Using a paramedian approach from the side mentioned above, the region overlying the inferior lamina was localized under fluoroscopic visualization and the soft tissues overlying this structure were infiltrated with 4 ml. of 1% Lidocaine without Epinephrine. The Tuohy needle was inserted into the epidural space using a paramedian approach.   The epidural space was localized using loss of resistance along with lateral and bi-planar fluoroscopic views.  After negative aspirate for air, blood, and CSF, a 2 ml. volume of Isovue-250 was injected into the epidural space and the flow of contrast was observed. Radiographs were obtained for documentation purposes.    The injectate was administered into the level noted above.   Additional Comments:  The patient tolerated the procedure well Dressing: Band-Aid    Post-procedure details: Patient was observed during the procedure. Post-procedure instructions were reviewed.  Patient left the clinic in stable condition.   Clinical History: MRI LUMBAR SPINE WITHOUT CONTRAST  TECHNIQUE: Multiplanar, multisequence MR imaging of the lumbar spine was performed. No intravenous contrast was administered.  COMPARISON:  Lumbar radiographs 01/15/2017.  FINDINGS: Segmentation: Normal lumbar segmentation demonstrated Frye the comparison radiographs.  Alignment:  Moderate dextroconvex lumbar scoliosis. Straightening of lumbar lordosis. Mild retrolisthesis at L4-L5 and L5-S1.  Vertebrae: Chronic degenerative endplate marrow signal changes  at L4-L5. Background bone marrow signal is within normal limits. No marrow edema or evidence of acute osseous abnormality. Intact visible sacrum and SI joints.  Conus medullaris: Extends to the L1 level and appears normal.  Paraspinal and other soft tissues: Negative.  Disc levels:  T10-T11:  Partially visible mild disc bulge.  T11-12: Mild circumferential disc bulge. Mild to moderate facet hypertrophy. Mild left and moderate to severe right T11 foraminal stenosis.  T12-L1:  Negative.  L1-L2: Mild circumferential disc bulge with broad-based posterior component. Mild facet hypertrophy. Mild bilateral L1 foraminal stenosis.  L2-L3: Mild circumferential disc bulge. Mild facet hypertrophy. No stenosis.  L3-L4: Mild facet and ligament flavum hypertrophy greater Frye the right. Borderline to mild left L3 foraminal stenosis.  L4-L5: Severe disc space loss. Circumferential disc osteophyte complex with broad-based posterior component. Mild to moderate facet and ligament flavum hypertrophy. Borderline to mild spinal stenosis. Mild to moderate right lateral recess and right foraminal stenosis. Mild left foraminal stenosis.  L5-S1: Disc space loss. Mostly far lateral disc osteophyte complex. No spinal or lateral recess stenosis. Borderline to mild L5 bilateral foraminal stenosis.  IMPRESSION: 1. Dextroconvex lumbar scoliosis with straightening of lordosis and mild retrolisthesis at L4-L5 and L5-S1. 2. Advanced chronic L4-L5 and L5-S1 disc degeneration with endplate spurring. Up to mild spinal stenosis at L4-L5 associated with up to moderate right neural foraminal and right lateral recess stenosis at the right L4 and L5 nerve levels, respectively. 3. Only mild neural foraminal stenosis at L5-S1. Intermittent mild lumbar foraminal stenosis elsewhere. 4. T11-T12 disc and advanced posterior element degeneration with up to severe right T11 foraminal  stenosis.   Electronically Signed   By: Tracy Frye  Tracy M.D.   Frye: 03/25/2017 08:24   She reports that  has never smoked. she has never used smokeless tobacco. No results for input(s): HGBA1C, LABURIC in the last 8760 hours.  Objective:  VS:  HT:    WT:   BMI:     BP:121/74  HR:70bpm  TEMP:98.1 F (36.7 C)(Oral)  RESP:96 % Physical Exam  Ortho Exam Imaging: No results found.  Past Medical/Family/Surgical/Social History: Medications & Allergies reviewed per EMR, new medications updated. There are no active problems to display for this patient.  History reviewed. No pertinent past medical history. History reviewed. No pertinent family history. History reviewed. No pertinent surgical history. Social History   Occupational History  . Not Frye file  Tobacco Use  . Smoking status: Never Smoker  . Smokeless tobacco: Never Used  Substance and Sexual Activity  . Alcohol use: No  . Drug use: Not Frye file  . Sexual activity: Not Frye file

## 2017-08-22 NOTE — Telephone Encounter (Signed)
Completed.

## 2017-10-17 ENCOUNTER — Ambulatory Visit: Payer: Managed Care, Other (non HMO) | Admitting: Sports Medicine

## 2017-10-17 ENCOUNTER — Encounter: Payer: Self-pay | Admitting: Sports Medicine

## 2017-10-17 DIAGNOSIS — M1990 Unspecified osteoarthritis, unspecified site: Secondary | ICD-10-CM

## 2017-10-17 DIAGNOSIS — G8929 Other chronic pain: Secondary | ICD-10-CM

## 2017-10-17 DIAGNOSIS — M779 Enthesopathy, unspecified: Secondary | ICD-10-CM

## 2017-10-17 DIAGNOSIS — M25572 Pain in left ankle and joints of left foot: Secondary | ICD-10-CM | POA: Diagnosis not present

## 2017-10-17 DIAGNOSIS — M7752 Other enthesopathy of left foot: Secondary | ICD-10-CM

## 2017-10-17 MED ORDER — TRAMADOL HCL 50 MG PO TABS: 50.0000 mg | ORAL_TABLET | Freq: Three times a day (TID) | ORAL | 0 refills | Status: DC | PRN

## 2017-10-17 NOTE — Progress Notes (Signed)
Patient ID: Tracy Frye, female   DOB: 1955/06/21, 62 y.o.   MRN: [id number]  Subjective: Tracy Frye is a 62 y.o. 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  Patient states that her ankle brace and diclofenac and icing helps some but by end of day pain is excruciating. Patient denies any other pedal complaints.   There are no active problems to display for this patient.  Current Outpatient Medications on File Prior to Visit  Medication Sig Dispense Refill  . aspirin EC 81 MG tablet Take 81 mg by mouth.    . diazepam (VALIUM) 5 MG tablet Take 1-2 tablets (5-10 mg total) by mouth every 6 (six) hours as needed for muscle spasms. 60 tablet 0  . diclofenac (VOLTAREN) 75 MG EC tablet TAKE 1 TABLET BY MOUTH 2 (TWO) TIMES DAILY. 90 tablet 5  . HYDROcodone-acetaminophen (NORCO) 5-325 MG tablet Take 1-2 tablets by mouth at bedtime as needed. 20 tablet 0  . meloxicam (MOBIC) 15 MG tablet Take 1 tablet (15 mg total) by mouth daily. 30 tablet 2  . methocarbamol (ROBAXIN) 500 MG tablet Take 1 tablet (500 mg total) by mouth every 6 (six) hours as needed for muscle spasms. 30 tablet 2  . NONFORMULARY OR COMPOUNDED ITEM [facility]:  Combination Pain Cream - Baclofen 2%, doxepin 5%, Gabapentin 6%, Topiramate 2%, :Pentoxifylline 3%, apply 1-2 grams to affected area 3-4 times daily. 120 each 2  . predniSONE (STERAPRED UNI-PAK 21 TAB) 10 MG (21) TBPK tablet Take as directed 21 tablet 0   Current Facility-Administered Medications on File Prior to Visit  Medication Dose Route Frequency Provider Last Rate Last Dose  . dexamethasone (DECADRON) injection 4 mg  4 mg Intra-articular  Once Delories Heinz, DPM      . triamcinolone acetonide (KENALOG) 10 MG/ML injection 10 mg  10 mg Other Once Asencion Islam, DPM      . triamcinolone acetonide (KENALOG) 10 MG/ML injection 10 mg  10 mg Other Once Asencion Islam, DPM      . triamcinolone acetonide (KENALOG) 10 MG/ML injection 10 mg  10 mg Other Once Asencion Islam, DPM      . triamcinolone acetonide (KENALOG) 10 MG/ML injection 10 mg  10 mg Other Once [place], Cletis Muma, DPM      . triamcinolone acetonide (KENALOG-40) injection 20 mg  20 mg Other Once [place], Ivey Nembhard, DPM      . triamcinolone acetonide (KENALOG-40) injection 20 mg  20 mg Other Once Asencion Islam, DPM       Allergies  Allergen Reactions  . Sulfa Antibiotics Rash   Objective:  General: Alert and oriented x3 in no acute distress  Dermatology: No open lesions bilateral lower extremities, no webspace macerations, no ecchymosis bilateral, all nails x 10 are well manicured.  Vascular: Dorsalis Pedis and Posterior Tibial pedal pulses 2/4, Capillary Fill Time 3 seconds,(+) pedal hair growth bilateral, + mild focal edema to the left lateral ankle, There is also some swelling to the right ankle today as well, Temperature gradient within normal limits.  Neurology: Gross sensation intact via light touch bilateral, Protective sensation intact with Phoebe Perch Monofilament to all  pedal sites, Position sense intact, vibratory intact bilateral, Deep tendon reflexes within normal limits bilateral, No babinski sign present bilateral. (-)Tinels sign left foot.   Musculoskeletal: There is tenderness with palpation at sinus tarsi on Left and mild tenderness to peroneal tendon course posterior aspect of fibula on left, No pain with calf compression bilateral. Ankle, Subtalar joint, Midtarsal, MTPJ range of motion is within normal limits except with mild limitation on left, there is no 1st ray hypermobility noted bilateral, collapsing pes planus with impingement at ankle on  weightbearing left>right.Strength within normal limits in all groups bilateral.  Assessment and Plan: Problem List Items Addressed This Visit    None    Visit Diagnoses    Capsulitis of ankle, left    -  Primary   Relevant Medications   traMADol (ULTRAM) 50 MG tablet   Arthritis       Relevant Medications   traMADol (ULTRAM) 50 MG tablet   Chronic pain of left ankle       Relevant Medications   traMADol (ULTRAM) 50 MG tablet   Tendinitis         -Complete examination performed -Previous Xrays reviewed since patient refused a new set of x-rays for today's visit -Discussed continued care for STJ arthritis with capsulitis and sometimes overuse tendinitis -After oral consent and aseptic prep, injected a mixture containing 1 ml of 2%  plain lidocaine, 1 ml 0.5% plain marcaine, 0.5 ml of kenalog 40 and 0.5 ml of dexamethasone phosphate into left lateral ankle at sinus tarsi without complication. Post-injection care discussed with patient.  -Prescribed tramadol to take for severe episodes of pain however may still take diclofenac for mild pain and inflammation -Continue with ankle stabilizing brace -Continue topical pain cream from [facility] -Cont with left compression stocking to assist with edema control -Recommend cont with ice, elevate, and epsom salts as needed -Advised patient we will look into see if her insurance will approve epi fix injection meanwhile to see if this could also help with pain and inflammation along her joint and tendon and capsule -Patient to return to office as needed or once called if we get approval for the injection or sooner if condition worsens.  Asencion Islam, DPM

## 2017-10-18 ENCOUNTER — Telehealth: Payer: Self-pay | Admitting: *Deleted

## 2017-10-18 NOTE — Telephone Encounter (Signed)
-----   Message from Asencion Islam, [place] sent at 10/17/2017  3:23 PM EDT ----- Regarding: Epifix Injection  Contact Todd to see if we can use injection for ankle arthritis and peroneal tendonitis on left. If so please run IVR Thanks Dr. Kathie Rhodes

## 2017-10-21 NOTE — Telephone Encounter (Signed)
-----   Message from Asencion Islamitorya [place], [place] sent at 10/17/2017  3:23 PM EDT ----- Regarding: Epifix Injection  Contact [facility] to see if we can use injection for ankle arthritis and peroneal tendonitis on left. If so please run IVR Thanks Dr. SKathie Rhodes

## 2017-10-21 NOTE — Telephone Encounter (Signed)
Emailed question to T. Muncey [facility].

## 2017-11-06 ENCOUNTER — Ambulatory Visit
Admission: RE | Admit: 2017-11-06 | Discharge: 2017-11-06 | Disposition: A | Discharge status: Home / Self Care | Payer: Managed Care, Other (non HMO) | Source: Ambulatory Visit | Attending: Family Medicine | Admitting: Family Medicine

## 2017-11-06 DIAGNOSIS — Z1231 Encounter for screening mammogram for malignant neoplasm of breast: Secondary | ICD-10-CM

## 2017-12-23 ENCOUNTER — Telehealth: Payer: Self-pay | Admitting: Sports Medicine

## 2017-12-23 NOTE — Telephone Encounter (Signed)
I'm a pt of Dr. Wynema BirchStover's and she talked about me having surgery. I'm calling to see what is the procedure, what do I have to do in order to prepare for the surgery and to line up the surgery. I would appreciate a call back at [phone]. Thank you.

## 2017-12-24 ENCOUNTER — Telehealth: Payer: Self-pay | Admitting: Sports Medicine

## 2017-12-24 NOTE — Telephone Encounter (Signed)
Called pt back in regards to message she left yesterday in regards to surgery. I explained I'm filling in for the surgical coordinator this week and the best option would be for her to schedule an appointment with Dr. Marylene LandStover to go over that information and if Dr. Marylene LandStover thinks she needs surgery she could sign the consent forms that day and they could go ahead and get her scheduled for surgery. I transferred the pt to scheduler Stan Headmanda S at [phone] to get an appointment scheduled.

## 2018-01-02 ENCOUNTER — Telehealth: Payer: Self-pay | Admitting: *Deleted

## 2018-01-02 NOTE — Telephone Encounter (Signed)
I called the patient and asked if someone had called her back.  She said someone did and she has scheduled an appointment with Dr. Marylene LandStover.

## 2018-01-03 ENCOUNTER — Encounter: Payer: Self-pay | Admitting: Sports Medicine

## 2018-01-03 ENCOUNTER — Ambulatory Visit: Payer: Managed Care, Other (non HMO) | Admitting: Sports Medicine

## 2018-01-03 DIAGNOSIS — M779 Enthesopathy, unspecified: Secondary | ICD-10-CM

## 2018-01-03 DIAGNOSIS — M25572 Pain in left ankle and joints of left foot: Secondary | ICD-10-CM | POA: Diagnosis not present

## 2018-01-03 DIAGNOSIS — M19072 Primary osteoarthritis, left ankle and foot: Secondary | ICD-10-CM | POA: Diagnosis not present

## 2018-01-03 DIAGNOSIS — Z01818 Encounter for other preprocedural examination: Secondary | ICD-10-CM

## 2018-01-03 DIAGNOSIS — G8929 Other chronic pain: Secondary | ICD-10-CM

## 2018-01-03 DIAGNOSIS — M7752 Other enthesopathy of left foot: Secondary | ICD-10-CM

## 2018-01-03 NOTE — Patient Instructions (Signed)
Pre-Operative Instructions  Congratulations, you have decided to take an important step towards improving your quality of life.  You can be assured that the doctors and staff at [facility] will be with you every step of the way.  Here are some important things you should know:  1. Plan to be at the surgery center/hospital at least 1 (one) hour prior to your scheduled time, unless otherwise directed by the surgical center/hospital staff.  You must have a responsible adult accompany you, remain during the surgery and drive you home.  Make sure you have directions to the surgical center/hospital to ensure you arrive on time. 2. If you are having surgery at [facility] or [facility] hospitals, you will need a copy of your medical history and physical form from your family physician within one month prior to the date of surgery. We will give you a form for your primary physician to complete.  3. We make every effort to accommodate the date you request for surgery.  However, there are times where surgery dates or times have to be moved.  We will contact you as soon as possible if a change in schedule is required.   4. No aspirin/ibuprofen for one week before surgery.  If you are on aspirin, any non-steroidal anti-inflammatory medications (Mobic, Aleve, Ibuprofen) should not be taken seven (7) days prior to your surgery.  You make take Tylenol for pain prior to surgery.  5. Medications - If you are taking daily heart and blood pressure medications, seizure, reflux, allergy, asthma, anxiety, pain or diabetes medications, make sure you notify the surgery center/hospital before the day of surgery so they can tell you which medications you should take or avoid the day of surgery. 6. No food or drink after midnight the night before surgery unless directed otherwise by surgical center/hospital staff. 7. No alcoholic beverages 24-hours prior to surgery.  No smoking 24-hours prior or 24-hours after  surgery. 8. Wear loose pants or shorts. They should be loose enough to fit over bandages, boots, and casts. 9. Don't wear slip-on shoes. Sneakers are preferred. 10. Bring your boot with you to the surgery center/hospital.  Also bring crutches or a walker if your physician has prescribed it for you.  If you do not have this equipment, it will be provided for you after surgery. 11. If you have not been contacted by the surgery center/hospital by the day before your surgery, call to confirm the date and time of your surgery. 12. Leave-time from work may vary depending on the type of surgery you have.  Appropriate arrangements should be made prior to surgery with your employer. 13. Prescriptions will be provided immediately following surgery by your doctor.  Fill these as soon as possible after surgery and take the medication as directed. Pain medications will not be refilled on weekends and must be approved by the doctor. 14. Remove nail polish on the operative foot and avoid getting pedicures prior to surgery. 15. Wash the night before surgery.  The night before surgery wash the foot and leg well with water and the antibacterial soap provided. Be sure to pay special attention to beneath the toenails and in between the toes.  Wash for at least three (3) minutes. Rinse thoroughly with water and dry well with a towel.  Perform this wash unless told not to do so by your physician.  Enclosed: 1 Ice pack (please put in freezer the night before surgery)   1 Hibiclens skin cleaner     Pre-op instructions  If you have any questions regarding the instructions, please do not hesitate to call our office.  [place]: [address], [place], [place] [address] -- [phone]  [place]: [address] [place], [place] [address] -- [phone]  [place]: [address]  [place], [place] [address] -- [phone]  [place]: [address], [address], [place], [place] [address] -- [phone]  Website:  [email or web address] 

## 2018-01-03 NOTE — Progress Notes (Signed)
Patient ID: Tracy Frye, female   DOB: 1955/09/10, 62 y.o.   MRN: [id number]  Subjective: Tracy CollumSharon M Frye is a 62 y.o. female patient who returns to office for follow up evaluation of Left ankle pain; Reports that she wants to discuss surgery,  pain in her ankle with swelling states pain is 7 out of 10 sharp and is becoming constant states that on Wednesday she had a lot of pain and swelling after spending time with her grandchildren.   Patient states that her ankle brace and tramadol, and diclofenac and icing helps some but by end of day pain is excruciating. Patient denies any other pedal complaints.   There are no active problems to display for this patient.  Current Outpatient Medications on File Prior to Visit  Medication Sig Dispense Refill  . aspirin EC 81 MG tablet Take 81 mg by mouth.    . diazepam (VALIUM) 5 MG tablet Take 1-2 tablets (5-10 mg total) by mouth every 6 (six) hours as needed for muscle spasms. 60 tablet 0  . diclofenac (VOLTAREN) 75 MG EC tablet TAKE 1 TABLET BY MOUTH 2 (TWO) TIMES DAILY. 90 tablet 5  . HYDROcodone-acetaminophen (NORCO) 5-325 MG tablet Take 1-2 tablets by mouth at bedtime as needed. 20 tablet 0  . meloxicam (MOBIC) 15 MG tablet Take 1 tablet (15 mg total) by mouth daily. 30 tablet 2  . methocarbamol (ROBAXIN) 500 MG tablet Take 1 tablet (500 mg total) by mouth every 6 (six) hours as needed for muscle spasms. 30 tablet 2  . NONFORMULARY OR COMPOUNDED ITEM [facility]:  Combination Pain Cream - Baclofen 2%, doxepin 5%, Gabapentin 6%, Topiramate 2%, :Pentoxifylline 3%, apply 1-2 grams to affected area 3-4 times daily. 120 each 2  . predniSONE (STERAPRED UNI-PAK 21 TAB) 10 MG (21) TBPK tablet Take as directed 21 tablet 0  . traMADol (ULTRAM) 50 MG tablet Take 1 tablet (50 mg total) by mouth every 8 (eight) hours as needed. 15 tablet 0   Current Facility-Administered Medications on File Prior to Visit  Medication Dose Route Frequency Provider Last Rate  Last Dose  . dexamethasone (DECADRON) injection 4 mg  4 mg Intra-articular Once Delories Heinzgerton, Kathryn P, DPM      . triamcinolone acetonide (KENALOG) 10 MG/ML injection 10 mg  10 mg Other Once Asencion IslamStover, Kelten Enochs, DPM      . triamcinolone acetonide (KENALOG) 10 MG/ML injection 10 mg  10 mg Other Once Asencion IslamStover, Davante Gerke, DPM      . triamcinolone acetonide (KENALOG) 10 MG/ML injection 10 mg  10 mg Other Once Asencion IslamStover, Briauna Gilmartin, DPM      . triamcinolone acetonide (KENALOG) 10 MG/ML injection 10 mg  10 mg Other Once [place], Rolonda Pontarelli, DPM      . triamcinolone acetonide (KENALOG-40) injection 20 mg  20 mg Other Once [place], Dragan Tamburrino, DPM      . triamcinolone acetonide (KENALOG-40) injection 20 mg  20 mg Other Once Asencion IslamStover, Jameis Newsham, DPM       Allergies  Allergen Reactions  . Sulfa Antibiotics Rash   No family history on file.  Social History   Socioeconomic History  . Marital status: Married    Spouse name: Not on file  . Number of children: Not on file  . Years of education: Not on file  . Highest education level: Not on file  Occupational History  . Not on file  Social Needs  . Financial resource strain: Not on file  . Food insecurity:    Worry:  Not on file    Inability: Not on file  . Transportation needs:    Medical: Not on file    Non-medical: Not on file  Tobacco Use  . Smoking status: Never Smoker  . Smokeless tobacco: Never Used  Substance and Sexual Activity  . Alcohol use: No  . Drug use: Not on file  . Sexual activity: Not on file  Lifestyle  . Physical activity:    Days per week: Not on file    Minutes per session: Not on file  . Stress: Not on file  Relationships  . Social connections:    Talks on phone: Not on file    Gets together: Not on file    Attends religious service: Not on file    Active member of club or organization: Not on file    Attends meetings of clubs or organizations: Not on file    Relationship status: Not on file  Other Topics Concern  . Not on file   Social History Narrative  . Not on file    No past surgical history on file.  Objective:  General: Alert and oriented x3 in no acute distress  Dermatology: No open lesions bilateral lower extremities, no webspace macerations, no ecchymosis bilateral, all nails x 10 are well manicured.  Vascular: Dorsalis Pedis and Posterior Tibial pedal pulses 2/4, Capillary Fill Time 3 seconds,(+) pedal hair growth bilateral, + mild focal edema to the left lateral ankle, There is also some swelling to the right ankle today as well, Temperature gradient within normal limits.  Neurology: Gross sensation intact via light touch bilateral, Protective sensation intact with Semmes Weinstein Monofilament to all pedal sites, Position sense intact, vibratory intact bilateral, Deep tendon reflexes within normal limits bilateral, No babinski sign present bilateral. (-)Tinels sign left foot.   Musculoskeletal: There is tenderness with palpation at sinus tarsi on Left and mild tenderness to peroneal tendon course posterior aspect of fibula on left, No pain with calf compression bilateral. Ankle, Subtalar joint, Midtarsal, MTPJ range of motion is within normal limits except with mild limitation on left, there is no 1st ray hypermobility noted bilateral, collapsing pes planus with impingement at ankle on weightbearing left>right.Strength within normal limits in all groups bilateral.  Assessment and Plan: Problem List Items Addressed This Visit    None    Visit Diagnoses    Arthritis of left foot    -  Primary   Relevant Orders   DME Other see comment   Capsulitis of ankle, left       Relevant Orders   DME Other see comment   Tendinitis       Relevant Orders   DME Other see comment   Chronic pain of left ankle       Relevant Orders   DME Other see comment   Preop examination       Relevant Orders   DME Other see comment     -Complete examination performed -Previous Xrays and MRI reviewed -Discussed  continued care for STJ arthritis with capsulitis and sometimes overuse tendinitis --Patient opt for surgical management. Consent obtained for subtalar joint fusion left foot and exploration and evaluation of peroneal tendon.  Pre and Post op course explained. Risks, benefits, alternatives explained. No guarantees given or implied. Surgical booking slip submitted and provided patient with Surgical packet and info for [facility]. -To dispense crutches at surgical center and ordered knee scooter patient to be nonweightbearing and to be placed in a posterior splint at  time of surgery -Will prescribe tramadol and diclofenac to use for breakthrough pain if no relief with Norco at time of surgery -Continue with ankle stabilizing brace -Continue topical pain cream from [facility] -Cont with left compression stocking to assist with edema control -Recommend cont with ice, elevate, and epsom salts as needed until time for surgery -Handicap temporary for 3 months given -Patient to return to office after surgery or sooner if condition worsens.  Asencion Islam, DPM

## 2018-01-07 ENCOUNTER — Telehealth: Payer: Self-pay | Admitting: *Deleted

## 2018-01-07 NOTE — Telephone Encounter (Signed)
"  I'm trying to schedule my surgery with Dr. Marylene LandStover.  Today, I will be in and out so you can leave me a message or call me on my cell."

## 2018-01-08 NOTE — Telephone Encounter (Signed)
I am returning your call.  I apologize for not returning your call in a timely manner.  "That's okay."  Dr. Marylene LandStover does surgeries on Mondays.  Do you have a date in mind?  "I'd like to do it on September 9 because September 2 is a Holiday."  I'll get it scheduled.  Someone from the surgical center will give you a call the Friday before your surgery date.  They will give you your arrival time.  You need to go online and register with the surgical center, instructions are in the brochure that was given to you.  "I can do that today?"  Yes, you can.

## 2018-01-10 ENCOUNTER — Other Ambulatory Visit: Payer: Self-pay | Admitting: Sports Medicine

## 2018-01-10 DIAGNOSIS — M7752 Other enthesopathy of left foot: Secondary | ICD-10-CM

## 2018-01-10 DIAGNOSIS — G8929 Other chronic pain: Secondary | ICD-10-CM

## 2018-01-10 DIAGNOSIS — M25572 Pain in left ankle and joints of left foot: Secondary | ICD-10-CM

## 2018-01-10 DIAGNOSIS — M1990 Unspecified osteoarthritis, unspecified site: Secondary | ICD-10-CM

## 2018-01-14 ENCOUNTER — Telehealth: Payer: Self-pay | Admitting: Sports Medicine

## 2018-01-14 ENCOUNTER — Other Ambulatory Visit: Payer: Self-pay | Admitting: Sports Medicine

## 2018-01-14 DIAGNOSIS — M25572 Pain in left ankle and joints of left foot: Secondary | ICD-10-CM

## 2018-01-14 DIAGNOSIS — G8929 Other chronic pain: Secondary | ICD-10-CM

## 2018-01-14 DIAGNOSIS — M7752 Other enthesopathy of left foot: Secondary | ICD-10-CM

## 2018-01-14 DIAGNOSIS — M1990 Unspecified osteoarthritis, unspecified site: Secondary | ICD-10-CM

## 2018-01-14 MED ORDER — TRAMADOL HCL 50 MG PO TABS: 50.0000 mg | ORAL_TABLET | Freq: Three times a day (TID) | ORAL | 0 refills | Status: DC | PRN

## 2018-01-14 NOTE — Progress Notes (Signed)
Left message informing pt Dr. Marylene LandStover had ordered Tramadol and [facility] would [facility] [place] [phone].

## 2018-01-14 NOTE — Telephone Encounter (Signed)
I informed pt the Tr[facility]ad[facility] had [facility] [place]. I spoke with [facility] [id number] pharmacist and gave tramadol orders..Marland Kitchen

## 2018-01-14 NOTE — Telephone Encounter (Signed)
I contacted my pharmacy yesterday to get my tramadol refilled and apparently they won't refill it. I saw Dr. Marylene LandStover two weeks ago and she wanted to refill it but I thought I could hold off until the surgery. Now I can't hold off because the surgery is another two weeks away. So if I could get that refilled I would appreciate it. The best contact number is [phone]. Thank you.

## 2018-01-14 NOTE — Progress Notes (Signed)
Rx for Tramadol to be phoned in to pharmacy -Dr. Marylene LandStover

## 2018-01-27 ENCOUNTER — Other Ambulatory Visit: Payer: Self-pay | Admitting: Sports Medicine

## 2018-01-27 ENCOUNTER — Encounter: Payer: Self-pay | Admitting: Sports Medicine

## 2018-01-27 DIAGNOSIS — M25572 Pain in left ankle and joints of left foot: Secondary | ICD-10-CM

## 2018-01-27 DIAGNOSIS — M13872 Other specified arthritis, left ankle and foot: Secondary | ICD-10-CM | POA: Diagnosis not present

## 2018-01-27 DIAGNOSIS — M65872 Other synovitis and tenosynovitis, left ankle and foot: Secondary | ICD-10-CM | POA: Diagnosis not present

## 2018-01-27 DIAGNOSIS — M7752 Other enthesopathy of left foot: Secondary | ICD-10-CM

## 2018-01-27 DIAGNOSIS — Z9889 Other specified postprocedural states: Secondary | ICD-10-CM

## 2018-01-27 DIAGNOSIS — M1990 Unspecified osteoarthritis, unspecified site: Secondary | ICD-10-CM

## 2018-01-27 DIAGNOSIS — G8929 Other chronic pain: Secondary | ICD-10-CM

## 2018-01-27 MED ORDER — PROMETHAZINE HCL 25 MG PO TABS: 25.0000 mg | ORAL_TABLET | Freq: Three times a day (TID) | ORAL | 0 refills | Status: DC | PRN

## 2018-01-27 MED ORDER — DOCUSATE SODIUM 100 MG PO CAPS: 100.0000 mg | ORAL_CAPSULE | Freq: Two times a day (BID) | ORAL | 0 refills | Status: DC

## 2018-01-27 NOTE — Progress Notes (Signed)
Sent Rx for phenergan and colace to pharmacy. Rx written by hand for Norco and Tramadol for breakthrough pain post op. -Dr. Marylene Land

## 2018-01-28 ENCOUNTER — Telehealth: Payer: Self-pay | Admitting: Sports Medicine

## 2018-01-28 NOTE — Telephone Encounter (Signed)
Post op check phone call made to patient. Patient's husband reports that she is doing well with no pain. Reports no overnight problems however does state had a little bit of drainage quarter size that has not worsened. No other issues noted. -Dr. Marylene Land

## 2018-02-06 ENCOUNTER — Ambulatory Visit (INDEPENDENT_AMBULATORY_CARE_PROVIDER_SITE_OTHER): Payer: Managed Care, Other (non HMO) | Admitting: Sports Medicine

## 2018-02-06 ENCOUNTER — Ambulatory Visit (INDEPENDENT_AMBULATORY_CARE_PROVIDER_SITE_OTHER): Payer: Managed Care, Other (non HMO)

## 2018-02-06 ENCOUNTER — Encounter: Payer: Self-pay | Admitting: Sports Medicine

## 2018-02-06 VITALS — BP 132/84 | HR 89 | Temp 96.1°F | Resp 15

## 2018-02-06 DIAGNOSIS — M25572 Pain in left ankle and joints of left foot: Secondary | ICD-10-CM

## 2018-02-06 DIAGNOSIS — M1990 Unspecified osteoarthritis, unspecified site: Secondary | ICD-10-CM

## 2018-02-06 DIAGNOSIS — G8929 Other chronic pain: Secondary | ICD-10-CM

## 2018-02-06 DIAGNOSIS — Z9889 Other specified postprocedural states: Secondary | ICD-10-CM

## 2018-02-06 DIAGNOSIS — M779 Enthesopathy, unspecified: Secondary | ICD-10-CM

## 2018-02-06 DIAGNOSIS — M7752 Other enthesopathy of left foot: Secondary | ICD-10-CM

## 2018-02-06 DIAGNOSIS — M19072 Primary osteoarthritis, left ankle and foot: Secondary | ICD-10-CM

## 2018-02-06 MED ORDER — TRAMADOL HCL 50 MG PO TABS
50.0000 mg | ORAL_TABLET | Freq: Three times a day (TID) | ORAL | 0 refills | Status: DC | PRN
Start: 2018-02-06 — End: 2021-03-07

## 2018-02-06 MED ORDER — DICLOFENAC SODIUM 75 MG PO TBEC
75.0000 mg | DELAYED_RELEASE_TABLET | Freq: Two times a day (BID) | ORAL | 0 refills | Status: DC
Start: 2018-02-06 — End: 2018-02-19

## 2018-02-06 MED ORDER — HYDROCODONE-ACETAMINOPHEN 10-325 MG PO TABS: 1.0000 | ORAL_TABLET | Freq: Three times a day (TID) | ORAL | 0 refills | Status: DC | PRN

## 2018-02-06 NOTE — Progress Notes (Signed)
Subjective: Tracy Frye is a 62 y.o. female patient seen today in office for POV #1 (DOS 01-27-18), S/P left subtalar joint fusion. Patient admits pain at the surgical site worse at the end of the day some days 8 out of 10 but last night was 10 out of 10 especially based on how her foot is positioned when she is sleeping, denies calf pain, denies headache, chest pain, shortness of breath, nausea, vomiting, fever, or chills. Patient states that she currently is taking tramadol and only took a few doses of her Norco pain medicine. No other issues noted.   There are no active problems to display for this patient.   Current Outpatient Medications on File Prior to Visit  Medication Sig Dispense Refill  . aspirin EC 81 MG tablet Take 81 mg by mouth.    . diazepam (VALIUM) 5 MG tablet Take 1-2 tablets (5-10 mg total) by mouth every 6 (six) hours as needed for muscle spasms. 60 tablet 0  . docusate sodium (COLACE) 100 MG capsule Take 1 capsule (100 mg total) by mouth 2 (two) times daily. 10 capsule 0  . meloxicam (MOBIC) 15 MG tablet Take 1 tablet (15 mg total) by mouth daily. 30 tablet 2  . methocarbamol (ROBAXIN) 500 MG tablet Take 1 tablet (500 mg total) by mouth every 6 (six) hours as needed for muscle spasms. 30 tablet 2  . NONFORMULARY OR COMPOUNDED ITEM [facility]:  Combination Pain Cream - Baclofen 2%, doxepin 5%, Gabapentin 6%, Topiramate 2%, :Pentoxifylline 3%, apply 1-2 grams to affected area 3-4 times daily. 120 each 2  . predniSONE (STERAPRED UNI-PAK 21 TAB) 10 MG (21) TBPK tablet Take as directed 21 tablet 0  . promethazine (PHENERGAN) 25 MG tablet Take 1 tablet (25 mg total) by mouth every 8 (eight) hours as needed for nausea or vomiting. 20 tablet 0   Current Facility-Administered Medications on File Prior to Visit  Medication Dose Route Frequency Provider Last Rate Last Dose  . dexamethasone (DECADRON) injection 4 mg  4 mg Intra-articular Once Delories Heinz, DPM      .  triamcinolone acetonide (KENALOG) 10 MG/ML injection 10 mg  10 mg Other Once Asencion Islam, DPM      . triamcinolone acetonide (KENALOG) 10 MG/ML injection 10 mg  10 mg Other Once Asencion Islam, DPM      . triamcinolone acetonide (KENALOG) 10 MG/ML injection 10 mg  10 mg Other Once Asencion Islam, DPM      . triamcinolone acetonide (KENALOG) 10 MG/ML injection 10 mg  10 mg Other Once [place], Khia Dieterich, DPM      . triamcinolone acetonide (KENALOG-40) injection 20 mg  20 mg Other Once [place], Orange Hilligoss, DPM      . triamcinolone acetonide (KENALOG-40) injection 20 mg  20 mg Other Once Asencion Islam, DPM        Allergies  Allergen Reactions  . Sulfa Antibiotics Rash    Objective: There were no vitals filed for this visit.  General: No acute distress, AAOx3  Left foot: Sutures intact with no gapping or dehiscence however there is central maceration at surgical site, the outer dressings had a lot of bloody drainage from immediate postop drainage, mild swelling to right dorsal lateral foot with bruising, no erythema, no warmth, no active drainage, no other signs of infection noted, Capillary fill time <3 seconds in all digits, gross sensation present via light touch to left foot.  Mild pain or crepitation with range of motion left foot.  No pain with calf compression.   Post Op Xray, left foot the subtalar joint is in excellent alignment and position. Osteotomy site healing. Hardware intact. Soft tissue swelling within normal limits for post op status.   Assessment and Plan:  Problem List Items Addressed This Visit    None    Visit Diagnoses    Post-operative state    -  Primary   Relevant Medications   HYDROcodone-acetaminophen (NORCO) 10-325 MG tablet   traMADol (ULTRAM) 50 MG tablet   diclofenac (VOLTAREN) 75 MG EC tablet   Other Relevant Orders   DG Foot Complete Left   Arthritis of left foot       Relevant Medications   HYDROcodone-acetaminophen (NORCO) 10-325 MG tablet    traMADol (ULTRAM) 50 MG tablet   diclofenac (VOLTAREN) 75 MG EC tablet   Capsulitis of ankle, left       Relevant Medications   HYDROcodone-acetaminophen (NORCO) 10-325 MG tablet   traMADol (ULTRAM) 50 MG tablet   diclofenac (VOLTAREN) 75 MG EC tablet   Other Relevant Orders   DG Foot Complete Left   Arthritis       Relevant Medications   HYDROcodone-acetaminophen (NORCO) 10-325 MG tablet   traMADol (ULTRAM) 50 MG tablet   diclofenac (VOLTAREN) 75 MG EC tablet   Other Relevant Orders   DG Foot Complete Left   Tendinitis       Relevant Medications   HYDROcodone-acetaminophen (NORCO) 10-325 MG tablet   traMADol (ULTRAM) 50 MG tablet   diclofenac (VOLTAREN) 75 MG EC tablet   Other Relevant Orders   DG Foot Complete Left   Chronic pain of left ankle          -Patient seen and evaluated -X-rays reviewed -Applied Betadine and dry dressing, soft cast, and dry sterile dressing to surgical site left foot secured with Coban wrap and stockinet  -Advised patient to make sure to keep dressings clean, dry, and intact to left surgical site and advised patient if Coban is too tight may cut a small split and secured with an Ace wrap -Advised patient to continue with nonweightbearing with knee scooter -Advised patient to limit activity to necessity  -Advised patient to ice and elevate as necessary  -Refilled tramadol to take for moderate pain, Norco to take for severe pain, and diclofenac to take for mild pain.  Patient to continue with aspirin daily to prevent its blood clot while weightbearing. -Will plan for incision check at next office visit. In the meantime, patient to call office if any issues or problems arise.   Asencion Islamitorya Yani Lal, DPM

## 2018-02-12 ENCOUNTER — Encounter: Payer: Self-pay | Admitting: Sports Medicine

## 2018-02-12 ENCOUNTER — Ambulatory Visit (INDEPENDENT_AMBULATORY_CARE_PROVIDER_SITE_OTHER): Payer: Managed Care, Other (non HMO) | Admitting: Sports Medicine

## 2018-02-12 VITALS — BP 112/72 | HR 76 | Temp 96.2°F | Resp 16

## 2018-02-12 DIAGNOSIS — M779 Enthesopathy, unspecified: Secondary | ICD-10-CM

## 2018-02-12 DIAGNOSIS — M7752 Other enthesopathy of left foot: Secondary | ICD-10-CM

## 2018-02-12 DIAGNOSIS — M19072 Primary osteoarthritis, left ankle and foot: Secondary | ICD-10-CM

## 2018-02-12 DIAGNOSIS — G8929 Other chronic pain: Secondary | ICD-10-CM

## 2018-02-12 DIAGNOSIS — M1990 Unspecified osteoarthritis, unspecified site: Secondary | ICD-10-CM

## 2018-02-12 DIAGNOSIS — Z9889 Other specified postprocedural states: Secondary | ICD-10-CM

## 2018-02-12 DIAGNOSIS — M25572 Pain in left ankle and joints of left foot: Secondary | ICD-10-CM

## 2018-02-12 NOTE — Progress Notes (Signed)
Subjective: Tracy Frye is a 62 y.o. female patient seen today in office for POV #2 (DOS 01-27-18), S/P left subtalar joint fusion. Patient admits pain at the surgical site that is doing pretty good states that she still has some pain worse at the end of the day and when she is sitting sometimes at the end of the day can feel as if her foot or leg is swollen even with the soft cast however after elevation it appears to go down patient denies calf pain, warmth, redness shortness of breath or any other signs of an acute DVT.  Patient denies nausea vomiting fever chills. No other issues noted.   There are no active problems to display for this patient.   Current Outpatient Medications on File Prior to Visit  Medication Sig Dispense Refill  . aspirin EC 81 MG tablet Take 81 mg by mouth.    . diazepam (VALIUM) 5 MG tablet Take 1-2 tablets (5-10 mg total) by mouth every 6 (six) hours as needed for muscle spasms. 60 tablet 0  . diclofenac (VOLTAREN) 75 MG EC tablet Take 1 tablet (75 mg total) by mouth 2 (two) times daily. 30 tablet 0  . docusate sodium (COLACE) 100 MG capsule Take 1 capsule (100 mg total) by mouth 2 (two) times daily. 10 capsule 0  . HYDROcodone-acetaminophen (NORCO) 10-325 MG tablet Take 1 tablet by mouth every 8 (eight) hours as needed for severe pain. 20 tablet 0  . meloxicam (MOBIC) 15 MG tablet Take 1 tablet (15 mg total) by mouth daily. 30 tablet 2  . methocarbamol (ROBAXIN) 500 MG tablet Take 1 tablet (500 mg total) by mouth every 6 (six) hours as needed for muscle spasms. 30 tablet 2  . NONFORMULARY OR COMPOUNDED ITEM [facility]:  Combination Pain Cream - Baclofen 2%, doxepin 5%, Gabapentin 6%, Topiramate 2%, :Pentoxifylline 3%, apply 1-2 grams to affected area 3-4 times daily. 120 each 2  . predniSONE (STERAPRED UNI-PAK 21 TAB) 10 MG (21) TBPK tablet Take as directed 21 tablet 0  . promethazine (PHENERGAN) 25 MG tablet Take 1 tablet (25 mg total) by mouth every 8 (eight)  hours as needed for nausea or vomiting. 20 tablet 0  . traMADol (ULTRAM) 50 MG tablet Take 1 tablet (50 mg total) by mouth every 8 (eight) hours as needed for moderate pain. 20 tablet 0   Current Facility-Administered Medications on File Prior to Visit  Medication Dose Route Frequency Provider Last Rate Last Dose  . dexamethasone (DECADRON) injection 4 mg  4 mg Intra-articular Once Delories Heinz, DPM      . triamcinolone acetonide (KENALOG) 10 MG/ML injection 10 mg  10 mg Other Once Asencion Islam, DPM      . triamcinolone acetonide (KENALOG) 10 MG/ML injection 10 mg  10 mg Other Once Asencion Islam, DPM      . triamcinolone acetonide (KENALOG) 10 MG/ML injection 10 mg  10 mg Other Once Asencion Islam, DPM      . triamcinolone acetonide (KENALOG) 10 MG/ML injection 10 mg  10 mg Other Once [place], Joeziah Voit, DPM      . triamcinolone acetonide (KENALOG-40) injection 20 mg  20 mg Other Once [place], Manning Luna, DPM      . triamcinolone acetonide (KENALOG-40) injection 20 mg  20 mg Other Once Asencion Islam, DPM        Allergies  Allergen Reactions  . Sulfa Antibiotics Rash    Objective: There were no vitals filed for this visit.  General:  No acute distress, AAOx3  Left foot: Mild bruising to posterior heel, sutures intact with no gapping or dehiscence however there is resolved central maceration at surgical site, the outer dressings had a lot of bloody drainage from immediate postop drainage, mild swelling to right dorsal lateral foot with bruising, no erythema, no warmth, no active drainage, no other signs of infection noted, Capillary fill time <3 seconds in all digits, gross sensation present via light touch to left foot.  Mild pain or crepitation with range of motion left foot.  No pain with calf compression.   Assessment and Plan:  Problem List Items Addressed This Visit    None    Visit Diagnoses    Post-operative state    -  Primary   Arthritis of left foot       Capsulitis of  ankle, left       Arthritis       Tendinitis       Chronic pain of left ankle          -Patient seen and evaluated -Applied a small amount of antibiotic cream and dry dressing, soft cast, and dry sterile dressing to surgical site left foot secured with Coban wrap and stockinet; this will likely be the last soft cast for the patient -Advised patient to make sure to keep dressings clean, dry, and intact to left surgical site and advised patient if Coban is too tight may cut a small split and secured with an Ace wrap -Advised patient to continue with nonweightbearing with knee scooter -Advised patient to limit activity to necessity  -Advised patient to ice and elevate as necessary  -Continue to take tramadol to take for moderate pain, Norco to take for severe pain, and diclofenac to take for mild pain.  Patient to continue with aspirin daily to prevent its blood clot while weightbearing. -Will plan for incision check and possible suture removal worse especially at posterior heel at next office visit. In the meantime, patient to call office if any issues or problems arise.   Asencion Islam, DPM

## 2018-02-17 ENCOUNTER — Other Ambulatory Visit: Payer: Self-pay | Admitting: Sports Medicine

## 2018-02-17 DIAGNOSIS — M7752 Other enthesopathy of left foot: Secondary | ICD-10-CM

## 2018-02-17 DIAGNOSIS — M1990 Unspecified osteoarthritis, unspecified site: Secondary | ICD-10-CM

## 2018-02-17 DIAGNOSIS — M25572 Pain in left ankle and joints of left foot: Secondary | ICD-10-CM

## 2018-02-17 DIAGNOSIS — M779 Enthesopathy, unspecified: Secondary | ICD-10-CM

## 2018-02-17 DIAGNOSIS — G8929 Other chronic pain: Secondary | ICD-10-CM

## 2018-02-17 DIAGNOSIS — Z9889 Other specified postprocedural states: Secondary | ICD-10-CM

## 2018-02-19 ENCOUNTER — Ambulatory Visit (INDEPENDENT_AMBULATORY_CARE_PROVIDER_SITE_OTHER): Payer: Managed Care, Other (non HMO) | Admitting: Sports Medicine

## 2018-02-19 ENCOUNTER — Other Ambulatory Visit: Payer: Self-pay | Admitting: Sports Medicine

## 2018-02-19 ENCOUNTER — Encounter: Payer: Self-pay | Admitting: Sports Medicine

## 2018-02-19 VITALS — BP 121/74 | HR 87 | Temp 96.9°F | Resp 16

## 2018-02-19 DIAGNOSIS — M779 Enthesopathy, unspecified: Secondary | ICD-10-CM

## 2018-02-19 DIAGNOSIS — G8929 Other chronic pain: Secondary | ICD-10-CM

## 2018-02-19 DIAGNOSIS — Z9889 Other specified postprocedural states: Secondary | ICD-10-CM

## 2018-02-19 DIAGNOSIS — M1990 Unspecified osteoarthritis, unspecified site: Secondary | ICD-10-CM

## 2018-02-19 DIAGNOSIS — M25572 Pain in left ankle and joints of left foot: Secondary | ICD-10-CM

## 2018-02-19 DIAGNOSIS — M7752 Other enthesopathy of left foot: Secondary | ICD-10-CM

## 2018-02-19 DIAGNOSIS — M19072 Primary osteoarthritis, left ankle and foot: Secondary | ICD-10-CM

## 2018-02-19 NOTE — Progress Notes (Signed)
Subjective: Tracy Frye is a 62 y.o. female patient seen today in office for POV #3 (DOS 01-27-18), S/P left subtalar joint fusion. Patient admits pain at the surgical site that is improving with decreased swelling states that she just had one episode of pain yesterday that she treated with tramadol otherwise has been using less pain medication and less anti-inflammatories and states that she is still taking her aspirin to help prevent against a DVT.  Denies redness shortness of breath or any other signs of an acute DVT.  Patient denies nausea vomiting fever chills. No other issues noted.   There are no active problems to display for this patient.   Current Outpatient Medications on File Prior to Visit  Medication Sig Dispense Refill  . aspirin EC 81 MG tablet Take 81 mg by mouth.    . diazepam (VALIUM) 5 MG tablet Take 1-2 tablets (5-10 mg total) by mouth every 6 (six) hours as needed for muscle spasms. 60 tablet 0  . diclofenac (VOLTAREN) 75 MG EC tablet Take 1 tablet (75 mg total) by mouth 2 (two) times daily. 30 tablet 0  . docusate sodium (COLACE) 100 MG capsule Take 1 capsule (100 mg total) by mouth 2 (two) times daily. 10 capsule 0  . HYDROcodone-acetaminophen (NORCO) 10-325 MG tablet Take 1 tablet by mouth every 8 (eight) hours as needed for severe pain. 20 tablet 0  . meloxicam (MOBIC) 15 MG tablet Take 1 tablet (15 mg total) by mouth daily. 30 tablet 2  . methocarbamol (ROBAXIN) 500 MG tablet Take 1 tablet (500 mg total) by mouth every 6 (six) hours as needed for muscle spasms. 30 tablet 2  . NONFORMULARY OR COMPOUNDED ITEM [facility]:  Combination Pain Cream - Baclofen 2%, doxepin 5%, Gabapentin 6%, Topiramate 2%, :Pentoxifylline 3%, apply 1-2 grams to affected area 3-4 times daily. 120 each 2  . predniSONE (STERAPRED UNI-PAK 21 TAB) 10 MG (21) TBPK tablet Take as directed 21 tablet 0  . promethazine (PHENERGAN) 25 MG tablet Take 1 tablet (25 mg total) by mouth every 8 (eight)  hours as needed for nausea or vomiting. 20 tablet 0  . traMADol (ULTRAM) 50 MG tablet Take 1 tablet (50 mg total) by mouth every 8 (eight) hours as needed for moderate pain. 20 tablet 0   Current Facility-Administered Medications on File Prior to Visit  Medication Dose Route Frequency Provider Last Rate Last Dose  . dexamethasone (DECADRON) injection 4 mg  4 mg Intra-articular Once Delories Heinz, DPM      . triamcinolone acetonide (KENALOG) 10 MG/ML injection 10 mg  10 mg Other Once Asencion Islam, DPM      . triamcinolone acetonide (KENALOG) 10 MG/ML injection 10 mg  10 mg Other Once Asencion Islam, DPM      . triamcinolone acetonide (KENALOG) 10 MG/ML injection 10 mg  10 mg Other Once Asencion Islam, DPM      . triamcinolone acetonide (KENALOG) 10 MG/ML injection 10 mg  10 mg Other Once [place], Teresa Lemmerman, DPM      . triamcinolone acetonide (KENALOG-40) injection 20 mg  20 mg Other Once [place], Osker Ayoub, DPM      . triamcinolone acetonide (KENALOG-40) injection 20 mg  20 mg Other Once Asencion Islam, DPM        Allergies  Allergen Reactions  . Sulfa Antibiotics Rash    Objective: There were no vitals filed for this visit.  General: No acute distress, AAOx3  Left foot: Mild bruising to posterior  heel, sutures intact with no gapping or dehiscence, mild swelling to right dorsal lateral foot with bruising, no erythema, no warmth, no active drainage, no other signs of infection noted, Capillary fill time <3 seconds in all digits, gross sensation present via light touch to left foot.  Decreased pain or crepitation with range of motion left foot.  No pain with calf compression.   Assessment and Plan:  Problem List Items Addressed This Visit    None    Visit Diagnoses    Post-operative state    -  Primary   Arthritis of left foot       Capsulitis of ankle, left       Arthritis       Tendinitis       Chronic pain of left ankle          -Patient seen and evaluated -Sutures removed  at posterior heel and dry sterile dressing was applied covered with Ace wrap -Advised patient to continue with nonweightbearing with knee scooter -Advised patient to limit activity to necessity  -Advised patient to ice and elevate as necessary  -Continue to take tramadol to take for moderate pain, Norco to take for severe pain, and diclofenac to take for mild pain.  Patient to continue with aspirin daily to prevent its blood clot while non-weightbearing. -Will plan for x-rays, allowing partial toe-touch weightbearing, and removal of remaining stitches at the lateral foot incision at next office visit. In the meantime, patient to call office if any issues or problems arise.   Asencion Islam, DPM

## 2018-02-26 ENCOUNTER — Ambulatory Visit (INDEPENDENT_AMBULATORY_CARE_PROVIDER_SITE_OTHER): Payer: Managed Care, Other (non HMO)

## 2018-02-26 ENCOUNTER — Ambulatory Visit (INDEPENDENT_AMBULATORY_CARE_PROVIDER_SITE_OTHER): Payer: Managed Care, Other (non HMO) | Admitting: Sports Medicine

## 2018-02-26 ENCOUNTER — Encounter: Payer: Self-pay | Admitting: Sports Medicine

## 2018-02-26 VITALS — BP 119/69 | HR 78 | Temp 96.7°F | Resp 16

## 2018-02-26 DIAGNOSIS — M7752 Other enthesopathy of left foot: Secondary | ICD-10-CM | POA: Diagnosis not present

## 2018-02-26 DIAGNOSIS — M19072 Primary osteoarthritis, left ankle and foot: Secondary | ICD-10-CM | POA: Diagnosis not present

## 2018-02-26 DIAGNOSIS — M779 Enthesopathy, unspecified: Secondary | ICD-10-CM

## 2018-02-26 DIAGNOSIS — M1990 Unspecified osteoarthritis, unspecified site: Secondary | ICD-10-CM

## 2018-02-26 DIAGNOSIS — Z9889 Other specified postprocedural states: Secondary | ICD-10-CM

## 2018-02-26 NOTE — Progress Notes (Signed)
Subjective: Tracy Frye is a 62 y.o. female patient seen today in office for POV #4 (DOS 01-27-18), S/P left subtalar joint fusion. Patient reports that she is doing very good with no complaints denies nausea vomiting fever chills or any other constitutional symptoms at this time.  Patient reports that she has been taking her diclofenac and aspirin and been using her knee scooter and has not needed any of her pain medication.  No other issues noted.   There are no active problems to display for this patient.   Current Outpatient Medications on File Prior to Visit  Medication Sig Dispense Refill  . aspirin EC 81 MG tablet Take 81 mg by mouth.    . diazepam (VALIUM) 5 MG tablet Take 1-2 tablets (5-10 mg total) by mouth every 6 (six) hours as needed for muscle spasms. 60 tablet 0  . diclofenac (VOLTAREN) 75 MG EC tablet TAKE 1 TABLET BY MOUTH TWICE A DAY 30 tablet 0  . docusate sodium (COLACE) 100 MG capsule Take 1 capsule (100 mg total) by mouth 2 (two) times daily. 10 capsule 0  . HYDROcodone-acetaminophen (NORCO) 10-325 MG tablet Take 1 tablet by mouth every 8 (eight) hours as needed for severe pain. 20 tablet 0  . meloxicam (MOBIC) 15 MG tablet Take 1 tablet (15 mg total) by mouth daily. 30 tablet 2  . methocarbamol (ROBAXIN) 500 MG tablet Take 1 tablet (500 mg total) by mouth every 6 (six) hours as needed for muscle spasms. 30 tablet 2  . NONFORMULARY OR COMPOUNDED ITEM [facility]:  Combination Pain Cream - Baclofen 2%, doxepin 5%, Gabapentin 6%, Topiramate 2%, :Pentoxifylline 3%, apply 1-2 grams to affected area 3-4 times daily. 120 each 2  . predniSONE (STERAPRED UNI-PAK 21 TAB) 10 MG (21) TBPK tablet Take as directed 21 tablet 0  . promethazine (PHENERGAN) 25 MG tablet Take 1 tablet (25 mg total) by mouth every 8 (eight) hours as needed for nausea or vomiting. 20 tablet 0  . traMADol (ULTRAM) 50 MG tablet Take 1 tablet (50 mg total) by mouth every 8 (eight) hours as needed for moderate  pain. 20 tablet 0   Current Facility-Administered Medications on File Prior to Visit  Medication Dose Route Frequency Provider Last Rate Last Dose  . dexamethasone (DECADRON) injection 4 mg  4 mg Intra-articular Once Delories Heinz, DPM      . triamcinolone acetonide (KENALOG) 10 MG/ML injection 10 mg  10 mg Other Once Asencion Islam, DPM      . triamcinolone acetonide (KENALOG) 10 MG/ML injection 10 mg  10 mg Other Once Asencion Islam, DPM      . triamcinolone acetonide (KENALOG) 10 MG/ML injection 10 mg  10 mg Other Once Asencion Islam, DPM      . triamcinolone acetonide (KENALOG) 10 MG/ML injection 10 mg  10 mg Other Once [place], Luberta Grabinski, DPM      . triamcinolone acetonide (KENALOG-40) injection 20 mg  20 mg Other Once [place], Edward Guthmiller, DPM      . triamcinolone acetonide (KENALOG-40) injection 20 mg  20 mg Other Once Asencion Islam, DPM        Allergies  Allergen Reactions  . Sulfa Antibiotics Rash    Objective: There were no vitals filed for this visit.  General: No acute distress, AAOx3  Left foot: Mild bruising to posterior heel with superficial blister, sutures intact with no gapping or dehiscence, mild swelling to right dorsal lateral foot with bruising, no erythema, no warmth, no active  drainage, no other signs of infection noted, Capillary fill time <3 seconds in all digits, gross sensation present via light touch to left foot.  Decreased pain or crepitation with range of motion left foot.  No pain with calf compression.   Assessment and Plan:  Problem List Items Addressed This Visit    None    Visit Diagnoses    Post-operative state    -  Primary   Relevant Orders   DG Ankle Complete Left   For home use only DME Shower stool   Arthritis of left foot       Relevant Orders   DG Ankle Complete Left   Capsulitis of ankle, left       Relevant Orders   DG Ankle Complete Left   Arthritis       Tendinitis          -Patient seen and evaluated -X-rays reviewed  consistent with postoperative status hardware intact with 2 screws at subtalar joint -Sutures removed at dorsal lateral foot on left -Patient may shower with use of shower chair and allow Steri-Strips to fall off at the left lateral ankle and advised patient to apply a small amount of antibiotic cream and Band-Aid to the posterior heel -Advised patient to limit activity to necessity and may only toe-touch weight-bear for short distances or a few steps to get into the shower no full pressure at this time -Advised patient to ice and elevate as necessary and to offload heel to prevent worsening of heel wound -Continue to take tramadol to take for moderate pain, Norco to take for severe pain, and diclofenac to take for mild pain.  Patient to continue with aspirin daily to prevent its blood clot even while partial weightbearing at this point -Will plan for full weightbearing with Cam boot and physical therapy at next office visit. In the meantime, patient to call office if any issues or problems arise.   Asencion Islam, DPM

## 2018-02-26 NOTE — Progress Notes (Signed)
DOS: 01-27-2018 Left subtalar joint fusion with internal fixation and evaluate peroneal tendons  Dr. Wilhemina Cash

## 2018-03-03 ENCOUNTER — Other Ambulatory Visit: Payer: Self-pay | Admitting: Sports Medicine

## 2018-03-03 DIAGNOSIS — M7752 Other enthesopathy of left foot: Secondary | ICD-10-CM

## 2018-03-03 DIAGNOSIS — M19072 Primary osteoarthritis, left ankle and foot: Secondary | ICD-10-CM

## 2018-03-03 DIAGNOSIS — Z9889 Other specified postprocedural states: Secondary | ICD-10-CM

## 2018-03-03 DIAGNOSIS — M779 Enthesopathy, unspecified: Secondary | ICD-10-CM

## 2018-03-12 ENCOUNTER — Encounter: Payer: Self-pay | Admitting: Sports Medicine

## 2018-03-12 ENCOUNTER — Ambulatory Visit (INDEPENDENT_AMBULATORY_CARE_PROVIDER_SITE_OTHER): Payer: Managed Care, Other (non HMO) | Admitting: Sports Medicine

## 2018-03-12 VITALS — Temp 97.7°F

## 2018-03-12 DIAGNOSIS — M7752 Other enthesopathy of left foot: Secondary | ICD-10-CM

## 2018-03-12 DIAGNOSIS — M779 Enthesopathy, unspecified: Secondary | ICD-10-CM

## 2018-03-12 DIAGNOSIS — M19072 Primary osteoarthritis, left ankle and foot: Secondary | ICD-10-CM

## 2018-03-12 DIAGNOSIS — M1990 Unspecified osteoarthritis, unspecified site: Secondary | ICD-10-CM

## 2018-03-12 DIAGNOSIS — Z9889 Other specified postprocedural states: Secondary | ICD-10-CM

## 2018-03-12 MED ORDER — DICLOFENAC SODIUM 75 MG PO TBEC: 75.0000 mg | DELAYED_RELEASE_TABLET | Freq: Two times a day (BID) | ORAL | 0 refills | Status: DC

## 2018-03-12 NOTE — Progress Notes (Signed)
Subjective: Tracy Frye is a 62 y.o. female patient seen today in office for POV #5 (DOS 01-27-18), S/P left subtalar joint fusion. Patient reports that she is doing fine and is ready to get out of her boot.  Patient reports that she has been taking her diclofenac and aspirin and been using her knee scooter or crutches.  No other issues noted.   There are no active problems to display for this patient.   Current Outpatient Medications on File Prior to Visit  Medication Sig Dispense Refill  . aspirin EC 81 MG tablet Take 81 mg by mouth.    . diazepam (VALIUM) 5 MG tablet Take 1-2 tablets (5-10 mg total) by mouth every 6 (six) hours as needed for muscle spasms. 60 tablet 0  . diclofenac (VOLTAREN) 75 MG EC tablet TAKE 1 TABLET BY MOUTH TWICE A DAY 30 tablet 0  . docusate sodium (COLACE) 100 MG capsule Take 1 capsule (100 mg total) by mouth 2 (two) times daily. 10 capsule 0  . HYDROcodone-acetaminophen (NORCO) 10-325 MG tablet Take 1 tablet by mouth every 8 (eight) hours as needed for severe pain. 20 tablet 0  . meloxicam (MOBIC) 15 MG tablet Take 1 tablet (15 mg total) by mouth daily. 30 tablet 2  . methocarbamol (ROBAXIN) 500 MG tablet Take 1 tablet (500 mg total) by mouth every 6 (six) hours as needed for muscle spasms. 30 tablet 2  . NONFORMULARY OR COMPOUNDED ITEM [facility]:  Combination Pain Cream - Baclofen 2%, doxepin 5%, Gabapentin 6%, Topiramate 2%, :Pentoxifylline 3%, apply 1-2 grams to affected area 3-4 times daily. 120 each 2  . predniSONE (STERAPRED UNI-PAK 21 TAB) 10 MG (21) TBPK tablet Take as directed 21 tablet 0  . promethazine (PHENERGAN) 25 MG tablet Take 1 tablet (25 mg total) by mouth every 8 (eight) hours as needed for nausea or vomiting. 20 tablet 0  . traMADol (ULTRAM) 50 MG tablet Take 1 tablet (50 mg total) by mouth every 8 (eight) hours as needed for moderate pain. 20 tablet 0   Current Facility-Administered Medications on File Prior to Visit  Medication Dose  Route Frequency Provider Last Rate Last Dose  . dexamethasone (DECADRON) injection 4 mg  4 mg Intra-articular Once Delories Heinz, DPM      . triamcinolone acetonide (KENALOG) 10 MG/ML injection 10 mg  10 mg Other Once Asencion Islam, DPM      . triamcinolone acetonide (KENALOG) 10 MG/ML injection 10 mg  10 mg Other Once Asencion Islam, DPM      . triamcinolone acetonide (KENALOG) 10 MG/ML injection 10 mg  10 mg Other Once Asencion Islam, DPM      . triamcinolone acetonide (KENALOG) 10 MG/ML injection 10 mg  10 mg Other Once [place], Shimika Ames, DPM      . triamcinolone acetonide (KENALOG-40) injection 20 mg  20 mg Other Once [place], Ceferino Lang, DPM      . triamcinolone acetonide (KENALOG-40) injection 20 mg  20 mg Other Once Asencion Islam, DPM        Allergies  Allergen Reactions  . Sulfa Antibiotics Rash    Objective: There were no vitals filed for this visit.  General: No acute distress, AAOx3  Left foot: Mild bruising to posterior heel, incision at lateral foot and ankle appears to be healing well with no gapping or dehiscence, mild swelling to right dorsal lateral foot with bruising, no erythema, no warmth, no active drainage, no other signs of infection noted, Capillary fill  time <3 seconds in all digits, gross sensation present via light touch to left foot.  Decreased pain or crepitation with range of motion left foot.  No pain with calf compression.   Assessment and Plan:  Problem List Items Addressed This Visit    None    Visit Diagnoses    Post-operative state    -  Primary   Arthritis of left foot       Capsulitis of ankle, left          -Patient seen and evaluated -Advised patient to continue with protective Band-Aid to posterior heel when in boot otherwise open to air to allow the posterior heel bruise to resolve and continue to heal -Patient may now weight-bear with cam boot and prescription given for [facility] therapy at [facility] [place] -Advised patient to ice  and elevate as necessary and to offload heel to prevent worsening of heel wound -Refill diclofenac to take as instructed and advised patient that now she can discontinue aspirin since she is weightbearing -Will plan for follow-up to see how patient is doing with physical therapy and x-rays with possible transition to postop shoe at next office visit. In the meantime, patient to call office if any issues or problems arise.   Asencion Islam, DPM

## 2018-03-13 ENCOUNTER — Telehealth: Payer: Self-pay | Admitting: Sports Medicine

## 2018-03-13 NOTE — Telephone Encounter (Signed)
Completed.

## 2018-03-13 NOTE — Telephone Encounter (Signed)
This is Rodman Pickle with [facility] [employer]. Ms. Cristiano called wanting to schedule PT. We need her demographics faxed to [place] at [phone].

## 2018-03-26 ENCOUNTER — Ambulatory Visit (INDEPENDENT_AMBULATORY_CARE_PROVIDER_SITE_OTHER): Payer: Managed Care, Other (non HMO)

## 2018-03-26 ENCOUNTER — Ambulatory Visit (INDEPENDENT_AMBULATORY_CARE_PROVIDER_SITE_OTHER): Payer: Managed Care, Other (non HMO) | Admitting: Sports Medicine

## 2018-03-26 ENCOUNTER — Encounter: Payer: Self-pay | Admitting: Sports Medicine

## 2018-03-26 VITALS — BP 103/67 | HR 98 | Temp 96.7°F | Resp 15

## 2018-03-26 DIAGNOSIS — M7752 Other enthesopathy of left foot: Secondary | ICD-10-CM | POA: Diagnosis not present

## 2018-03-26 DIAGNOSIS — Z9889 Other specified postprocedural states: Secondary | ICD-10-CM

## 2018-03-26 DIAGNOSIS — M25572 Pain in left ankle and joints of left foot: Secondary | ICD-10-CM

## 2018-03-26 DIAGNOSIS — G8929 Other chronic pain: Secondary | ICD-10-CM

## 2018-03-26 MED ORDER — DICLOFENAC SODIUM 75 MG PO TBEC: 75.0000 mg | DELAYED_RELEASE_TABLET | Freq: Two times a day (BID) | ORAL | 0 refills | Status: DC

## 2018-03-26 NOTE — Progress Notes (Signed)
Subjective: Tracy Frye is a 62 y.o. female patient seen today in office for POV #6 (DOS 01-27-18), S/P left subtalar joint fusion. Patient reports that she is doing fine with physical therapy states that the first 2 sessions were a little rough but however she is making improvements.  Patient reports that she is having swelling that she is doing more physical therapy however admits that the pain has decreased and has no issues with walking with her cam boot.  No other issues noted.   Patient Active Problem List   Diagnosis Date Noted  . Chronic pain of left ankle 12/19/2015  . 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 aspirin EC 81 MG tablet Take 81 mg by mouth.    . diazepam (VALIUM) 5 MG tablet Take 1-2 tablets (5-10 mg total) by mouth every 6 (six) hours as needed for muscle spasms. 60 tablet 0  . diclofenac (VOLTAREN) 75 MG EC tablet Take 1 tablet (75 mg total) by mouth 2 (two) times daily. 30 tablet 0  . docusate sodium (COLACE) 100 MG capsule Take 1 capsule (100 mg total) by mouth 2 (two) times daily. 10 capsule 0  . HYDROcodone-acetaminophen (NORCO) 10-325 MG tablet Take 1 tablet by mouth every 8 (eight) hours as needed for severe pain. 20 tablet 0  . meloxicam (MOBIC) 15 MG tablet Take 1 tablet (15 mg total) by mouth daily. 30 tablet 2  . methocarbamol (ROBAXIN) 500 MG tablet Take 1 tablet (500 mg total) by mouth every 6 (six) hours as needed for muscle spasms. 30 tablet 2  . NONFORMULARY OR COMPOUNDED ITEM [facility]:  Combination Pain Cream - Baclofen 2%, doxepin 5%, Gabapentin 6%, Topiramate 2%, :Pentoxifylline 3%, apply 1-2 grams to affected area 3-4 times daily. 120 each 2  . predniSONE (STERAPRED UNI-PAK 21 TAB) 10 MG (21) TBPK tablet Take as directed 21 tablet 0  . promethazine (PHENERGAN) 25 MG tablet Take 1 tablet (25 mg total) by mouth every 8 (eight) hours as needed for  nausea or vomiting. 20 tablet 0  . traMADol (ULTRAM) 50 MG tablet Take 1 tablet (50 mg total) by mouth every 8 (eight) hours as needed for moderate pain. 20 tablet 0   Current Facility-Administered Medications on File Prior to Visit  Medication Dose Route Frequency Provider Last Rate Last Dose  . dexamethasone (DECADRON) injection 4 mg  4 mg Intra-articular Once Delories Heinz, DPM      . triamcinolone acetonide (KENALOG) 10 MG/ML injection 10 mg  10 mg Other Once Asencion Islam, DPM      . triamcinolone acetonide (KENALOG) 10 MG/ML injection 10 mg  10 mg Other Once Asencion Islam, DPM      . triamcinolone acetonide (KENALOG) 10 MG/ML injection 10 mg  10 mg Other Once Asencion Islam, DPM      . triamcinolone acetonide (KENALOG) 10 MG/ML injection 10 mg  10 mg Other Once [place], Michale Emmerich, DPM      . triamcinolone acetonide (KENALOG-40) injection 20 mg  20 mg Other Once [place], Jeshurun [place], DPM      . triamcinolone acetonide (KENALOG-40) injection 20 mg  20 mg Other Once Asencion Islam, DPM        Allergies  Allergen Reactions  . Sulfa Antibiotics Rash    Objective: There were no vitals filed for this visit.  General: No acute distress, AAOx3  Left foot: Surgical sites well-healed,  resolved bruising, no erythema, no warmth, no active drainage, no other signs of infection noted, Capillary fill time <3 seconds in all digits, gross sensation present via light touch to left foot.  Decreased pain or crepitation with range of motion left foot.  No pain with calf compression.   X-rays consistent with postoperative status  Assessment and Plan:  Problem List Items Addressed This Visit      Other   Chronic pain of left ankle   Relevant Orders   DG Foot Complete Left    Other Visit Diagnoses    Post-operative state    -  Primary   Relevant Orders   DG Foot Complete Left      -Patient seen and evaluated -X-rays reviewed consistent with postoperative status as above -Patient use post  op shoe as dispensed this visit for at least 2 weeks and then may slowly transition to a tennis shoe as tolerated -Continue with  physical therapy at [facility] in [place] -Continue with diclofenac as needed and compression sleeve as dispensed this visit for edema control and rest ice elevation as needed especially now that she is in therapy -Will plan for transition x-ray and to check to see if patient is out of postop shoe to normal shoe at next visit. In the meantime, patient to call office if any issues or problems arise.   Asencion Islam, DPM

## 2018-04-23 ENCOUNTER — Ambulatory Visit (INDEPENDENT_AMBULATORY_CARE_PROVIDER_SITE_OTHER): Payer: Managed Care, Other (non HMO)

## 2018-04-23 ENCOUNTER — Encounter: Payer: Self-pay | Admitting: Sports Medicine

## 2018-04-23 ENCOUNTER — Ambulatory Visit (INDEPENDENT_AMBULATORY_CARE_PROVIDER_SITE_OTHER): Payer: Managed Care, Other (non HMO) | Admitting: Sports Medicine

## 2018-04-23 VITALS — BP 120/70 | HR 80 | Temp 97.9°F | Resp 16

## 2018-04-23 DIAGNOSIS — M7752 Other enthesopathy of left foot: Secondary | ICD-10-CM

## 2018-04-23 DIAGNOSIS — G8929 Other chronic pain: Secondary | ICD-10-CM

## 2018-04-23 DIAGNOSIS — M25572 Pain in left ankle and joints of left foot: Secondary | ICD-10-CM

## 2018-04-23 DIAGNOSIS — M19072 Primary osteoarthritis, left ankle and foot: Secondary | ICD-10-CM

## 2018-04-23 DIAGNOSIS — Z9889 Other specified postprocedural states: Secondary | ICD-10-CM

## 2018-04-23 NOTE — Progress Notes (Signed)
Subjective: Tracy Frye is a 62 y.o. female patient seen today in office for POV #7 (DOS 01-27-18), S/P left subtalar joint fusion. Patient reports that she is doing fine is back in normal tennis shoes but states that if she starts to do a lot of walking she experiences more swelling which then starts to cause some pain but it is tolerable.  Patient denies nausea vomiting fever chills or any other constitutional symptoms at this time.  No other issues noted.   Patient Active Problem List   Diagnosis Date Noted  . Chronic pain of left ankle 12/19/2015  . 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 aspirin EC 81 MG tablet Take 81 mg by mouth.    . cholestyramine (QUESTRAN) 4 g packet TAKE 1 PACKET (4 G TOTAL) BY MOUTH DAILY AS NEEDED FOR UP TO 30 DAYS.  9  . diazepam (VALIUM) 5 MG tablet Take 1-2 tablets (5-10 mg total) by mouth every 6 (six) hours as needed for muscle spasms. 60 tablet 0  . diclofenac (VOLTAREN) 75 MG EC tablet Take 1 tablet (75 mg total) by mouth 2 (two) times daily. 180 tablet 0  . docusate sodium (COLACE) 100 MG capsule Take 1 capsule (100 mg total) by mouth 2 (two) times daily. 10 capsule 0  . HYDROcodone-acetaminophen (NORCO) 10-325 MG tablet Take 1 tablet by mouth every 8 (eight) hours as needed for severe pain. 20 tablet 0  . meloxicam (MOBIC) 15 MG tablet Take 1 tablet (15 mg total) by mouth daily. 30 tablet 2  . methocarbamol (ROBAXIN) 500 MG tablet Take 1 tablet (500 mg total) by mouth every 6 (six) hours as needed for muscle spasms. 30 tablet 2  . NONFORMULARY OR COMPOUNDED ITEM [facility]:  Combination Pain Cream - Baclofen 2%, doxepin 5%, Gabapentin 6%, Topiramate 2%, :Pentoxifylline 3%, apply 1-2 grams to affected area 3-4 times daily. 120 each 2  . predniSONE (STERAPRED UNI-PAK 21 TAB) 10 MG (21) TBPK tablet Take as directed 21 tablet 0  . promethazine  (PHENERGAN) 25 MG tablet Take 1 tablet (25 mg total) by mouth every 8 (eight) hours as needed for nausea or vomiting. 20 tablet 0  . traMADol (ULTRAM) 50 MG tablet Take 1 tablet (50 mg total) by mouth every 8 (eight) hours as needed for moderate pain. 20 tablet 0   Current Facility-Administered Medications on File Prior to Visit  Medication Dose Route Frequency Provider Last Rate Last Dose  . dexamethasone (DECADRON) injection 4 mg  4 mg Intra-articular Once Delories Heinz, DPM      . triamcinolone acetonide (KENALOG) 10 MG/ML injection 10 mg  10 mg Other Once Asencion Islam, DPM      . triamcinolone acetonide (KENALOG) 10 MG/ML injection 10 mg  10 mg Other Once Asencion Islam, DPM      . triamcinolone acetonide (KENALOG) 10 MG/ML injection 10 mg  10 mg Other Once Asencion Islam, DPM      . triamcinolone acetonide (KENALOG) 10 MG/ML injection 10 mg  10 mg Other Once [place], Dereck [place], DPM      . triamcinolone acetonide (KENALOG-40) injection 20 mg  20 mg Other Once [place], Wandra Babin, DPM      . triamcinolone acetonide (KENALOG-40) injection 20 mg  20 mg Other Once Asencion Islam, DPM        Allergies  Allergen Reactions  . Sulfa Antibiotics Rash  Objective: There were no vitals filed for this visit.  General: No acute distress, AAOx3  Left foot: Surgical sites well-healed, no erythema, no warmth, no active drainage, no other signs of infection noted, Capillary fill time <3 seconds in all digits, gross sensation present via light touch to left foot.  Decreased pain or crepitation with range of motion left foot.  No pain with calf compression.   Subjective pain on the right foot likely compensation  X-rays consistent with postoperative status osteotomy site well-healed hardware intact  Assessment and Plan:  Problem List Items Addressed This Visit      Other   Chronic pain of left ankle   Relevant Orders   DG Foot Complete Left    Other Visit Diagnoses    Post-operative state     -  Primary   Relevant Orders   DG Foot Complete Left   Arthritis of left foot       Capsulitis of ankle, left          -Patient seen and evaluated -X-rays reviewed consistent with postoperative status as above -Physical therapy completed -Continue with good supportive shoes and advised patient to wear ankle support on right to avoid compensation pain -Continue with diclofenac as needed and compression sleeve as dispensed this visit for edema control and rest ice elevation as needed  -Will plan for postop check at next visit. In the meantime, patient to call office if any issues or problems arise.   Asencion Islamitorya Mela [place], DPM

## 2018-05-01 ENCOUNTER — Other Ambulatory Visit: Payer: Self-pay | Admitting: Sports Medicine

## 2018-05-01 DIAGNOSIS — M25572 Pain in left ankle and joints of left foot: Secondary | ICD-10-CM

## 2018-05-01 DIAGNOSIS — M19012 Primary osteoarthritis, left shoulder: Secondary | ICD-10-CM

## 2018-05-01 DIAGNOSIS — G8929 Other chronic pain: Secondary | ICD-10-CM

## 2018-05-01 DIAGNOSIS — M7752 Other enthesopathy of left foot: Secondary | ICD-10-CM

## 2018-05-01 DIAGNOSIS — Z9889 Other specified postprocedural states: Secondary | ICD-10-CM

## 2018-06-06 ENCOUNTER — Ambulatory Visit (INDEPENDENT_AMBULATORY_CARE_PROVIDER_SITE_OTHER): Payer: Managed Care, Other (non HMO) | Admitting: Sports Medicine

## 2018-06-06 ENCOUNTER — Encounter: Payer: Self-pay | Admitting: Sports Medicine

## 2018-06-06 VITALS — BP 101/67 | HR 67 | Temp 97.2°F | Resp 16

## 2018-06-06 DIAGNOSIS — Z9889 Other specified postprocedural states: Secondary | ICD-10-CM | POA: Diagnosis not present

## 2018-06-06 DIAGNOSIS — G8929 Other chronic pain: Secondary | ICD-10-CM

## 2018-06-06 DIAGNOSIS — M25572 Pain in left ankle and joints of left foot: Secondary | ICD-10-CM | POA: Diagnosis not present

## 2018-06-06 DIAGNOSIS — M7752 Other enthesopathy of left foot: Secondary | ICD-10-CM

## 2018-06-06 DIAGNOSIS — M25472 Effusion, left ankle: Secondary | ICD-10-CM

## 2018-06-06 DIAGNOSIS — M19072 Primary osteoarthritis, left ankle and foot: Secondary | ICD-10-CM | POA: Diagnosis not present

## 2018-06-06 NOTE — Progress Notes (Signed)
Subjective: Tracy Frye is a 63 y.o. female patient seen today in office for POV #8 (DOS 01-27-18), S/P left subtalar joint fusion. Patient reports that she is doing great with no pain just swelling.  Patient denies nausea vomiting fever chills or any other constitutional symptoms at this time.  No other issues noted.   Patient Active Problem List   Diagnosis Date Noted  . Chronic pain of left ankle 12/19/2015  . 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 aspirin EC 81 MG tablet Take 81 mg by mouth.    . cholestyramine (QUESTRAN) 4 g packet TAKE 1 PACKET (4 G TOTAL) BY MOUTH DAILY AS NEEDED FOR UP TO 30 DAYS.  9  . diazepam (VALIUM) 5 MG tablet Take 1-2 tablets (5-10 mg total) by mouth every 6 (six) hours as needed for muscle spasms. 60 tablet 0  . docusate sodium (COLACE) 100 MG capsule Take 1 capsule (100 mg total) by mouth 2 (two) times daily. 10 capsule 0  . HYDROcodone-acetaminophen (NORCO) 10-325 MG tablet Take 1 tablet by mouth every 8 (eight) hours as needed for severe pain. 20 tablet 0  . meloxicam (MOBIC) 15 MG tablet Take 1 tablet (15 mg total) by mouth daily. 30 tablet 2  . methocarbamol (ROBAXIN) 500 MG tablet Take 1 tablet (500 mg total) by mouth every 6 (six) hours as needed for muscle spasms. 30 tablet 2  . NONFORMULARY OR COMPOUNDED ITEM [facility]:  Combination Pain Cream - Baclofen 2%, doxepin 5%, Gabapentin 6%, Topiramate 2%, :Pentoxifylline 3%, apply 1-2 grams to affected area 3-4 times daily. 120 each 2  . predniSONE (STERAPRED UNI-PAK 21 TAB) 10 MG (21) TBPK tablet Take as directed 21 tablet 0  . promethazine (PHENERGAN) 25 MG tablet Take 1 tablet (25 mg total) by mouth every 8 (eight) hours as needed for nausea or vomiting. 20 tablet 0  . traMADol (ULTRAM) 50 MG tablet Take 1 tablet (50 mg total) by mouth every 8 (eight) hours as needed for moderate pain. 20 tablet 0    Current Facility-Administered Medications on File Prior to Visit  Medication Dose Route Frequency Provider Last Rate Last Dose  . dexamethasone (DECADRON) injection 4 mg  4 mg Intra-articular Once Delories Heinzgerton, Kathryn P, DPM      . triamcinolone acetonide (KENALOG) 10 MG/ML injection 10 mg  10 mg Other Once Asencion IslamStover, Taariq Leitz, DPM      . triamcinolone acetonide (KENALOG) 10 MG/ML injection 10 mg  10 mg Other Once Asencion IslamStover, Tayte Childers, DPM      . triamcinolone acetonide (KENALOG) 10 MG/ML injection 10 mg  10 mg Other Once Asencion IslamStover, Gilliam Hawkes, DPM      . triamcinolone acetonide (KENALOG) 10 MG/ML injection 10 mg  10 mg Other Once [place], Yunis Voorheis, DPM      . triamcinolone acetonide (KENALOG-40) injection 20 mg  20 mg Other Once [place], Ahsan Esterline, DPM      . triamcinolone acetonide (KENALOG-40) injection 20 mg  20 mg Other Once Asencion IslamStover, Tikesha Mort, DPM        Allergies  Allergen Reactions  . Diclofenac Other (See Comments)    Chest pain  . Sulfa Antibiotics Rash    Objective: There were no vitals filed for this visit.  General: No acute distress, AAOx3  Left foot: Surgical sites well-healed, no erythema, no warmth, no active drainage, no other signs of infection noted, Capillary fill time <3 seconds  in all digits, trace swelling to lateral ankle, gross sensation present via light touch to left foot.  No pain or crepitation with range of motion left foot.  No pain with calf compression.   Assessment and Plan:  Problem List Items Addressed This Visit      Other   Chronic pain of left ankle    Other Visit Diagnoses    Ankle swelling, left    -  Primary   Post-operative state       Arthritis of left foot       Capsulitis of ankle, left          -Patient seen and evaluated -Advised patient to continue with compression sock to assist with edema control  -Advised patient to continue with elevation when at rest and to continue with physical activities to tolerance -Will plan for final postop check and last  set of x-rays at next visit. In the meantime, patient to call office if any issues or problems arise.   Asencion Islam, DPM

## 2018-06-17 ENCOUNTER — Telehealth (INDEPENDENT_AMBULATORY_CARE_PROVIDER_SITE_OTHER): Payer: Self-pay | Admitting: *Deleted

## 2018-06-17 NOTE — Telephone Encounter (Signed)
Spoke with Marissa C and she states no PA is needed for [id number] Reference [phone]  Pt is scheduled 07/02/2018 with driver and no BTs.

## 2018-06-17 NOTE — Telephone Encounter (Signed)
ok 

## 2018-07-02 ENCOUNTER — Encounter (INDEPENDENT_AMBULATORY_CARE_PROVIDER_SITE_OTHER): Payer: Self-pay | Admitting: Physical Medicine and Rehabilitation

## 2018-07-02 ENCOUNTER — Ambulatory Visit (INDEPENDENT_AMBULATORY_CARE_PROVIDER_SITE_OTHER): Payer: Self-pay

## 2018-07-02 ENCOUNTER — Ambulatory Visit (INDEPENDENT_AMBULATORY_CARE_PROVIDER_SITE_OTHER): Payer: Managed Care, Other (non HMO) | Admitting: Physical Medicine and Rehabilitation

## 2018-07-02 VITALS — BP 122/88 | HR 60 | Temp 97.7°F

## 2018-07-02 DIAGNOSIS — M5116 Intervertebral disc disorders with radiculopathy, lumbar region: Secondary | ICD-10-CM | POA: Diagnosis not present

## 2018-07-02 DIAGNOSIS — M5416 Radiculopathy, lumbar region: Secondary | ICD-10-CM | POA: Diagnosis not present

## 2018-07-02 MED ORDER — METHYLPREDNISOLONE ACETATE 80 MG/ML IJ SUSP
80.0000 mg | Freq: Once | INTRAMUSCULAR | Status: AC
  Administered 2018-07-02: 80 mg

## 2018-07-02 NOTE — Progress Notes (Signed)
.  Numeric Pain Rating Scale and Functional Assessment Average Pain 10   In the last MONTH (on 0-10 scale) has pain interfered with the following?  1. General activity like being  able to carry out your everyday physical activities such as walking, climbing stairs, carrying groceries, or moving a chair?  Rating(9)   +Driver, -BT, -Dye Allergies.  

## 2018-07-03 NOTE — Procedures (Signed)
Lumbar Epidural Steroid Injection - Interlaminar Approach with Fluoroscopic Guidance  Patient: Tracy Frye      Date of Birth: 02-08-1956 MRN: [id number] PCP: Clovis Riley, L.August Saucer, MD      Visit Date: 07/02/2018   Universal Protocol:     Consent Given By: the patient  Position: PRONE  Additional Comments: Vital signs were monitored before and after the procedure. Patient was prepped and draped in the usual sterile fashion. The correct patient, procedure, and site was verified.   Injection Procedure Details:  Procedure Site One Meds Administered:  Meds ordered this encounter  Medications  . methylPREDNISolone acetate (DEPO-MEDROL) injection 80 mg     Laterality: Right  Location/Site:  L4-L5  Needle size: 20 G  Needle type: Tuohy  Needle Placement: Paramedian epidural  Findings:   -Comments: Excellent flow of contrast into the epidural space.  Procedure Details: Using a paramedian approach from the side mentioned above, the region overlying the inferior lamina was localized under fluoroscopic visualization and the soft tissues overlying this structure were infiltrated with 4 ml. of 1% Lidocaine without Epinephrine. The Tuohy needle was inserted into the epidural space using a paramedian approach.   The epidural space was localized using loss of resistance along with lateral and bi-planar fluoroscopic views.  After negative aspirate for air, blood, and CSF, a 2 ml. volume of Isovue-250 was injected into the epidural space and the flow of contrast was observed. Radiographs were obtained for documentation purposes.    The injectate was administered into the level noted above.   Additional Comments:  The patient tolerated the procedure well Dressing: Band-Aid    Post-procedure details: Patient was observed during the procedure. Post-procedure instructions were reviewed.  Patient left the clinic in stable condition.

## 2018-07-03 NOTE — Progress Notes (Signed)
Tracy Frye - 63 y.o. female MRN [id number]  Date of birth: 1956-05-15  Office Visit Note: Visit Date: 07/02/2018 PCP: Clovis Riley, L.August Saucer, MD Referred by: Clovis Riley, L.August Saucer, MD  Subjective: Chief Complaint  Patient presents with  . Lower Back - Pain   HPI:  Tracy Frye is a 63 y.o. female who comes in today For planned repeat right L4-5 interlaminar for steroid injection.  Patient has known history of arthritis at this level with lateral recess narrowing.  She has return of low back and right radicular symptoms.  She reports surgery on her foot in September 2019 and since that time she really could not put weight on her foot for a while and feels like this exacerbated her back symptoms.  She did use the elliptical trainer at times thinking that might be okay and she wonders if that is also flared up her symptoms.  We talked about activities and exercise with her and I think the elliptical is fine as long as is not reproducing the pain and she is doing it.  Injection was performed in March of last year and did give her quite a bit of relief up until September.  We did not repeat the injection today and see how she does.  Exam was nonfocal.  ROS Otherwise per HPI.  Assessment & Plan: Visit Diagnoses:  1. Lumbar radiculopathy   2. Radiculopathy due to lumbar intervertebral disc disorder     Plan: No additional findings.   Meds & Orders:  Meds ordered this encounter  Medications  . methylPREDNISolone acetate (DEPO-MEDROL) injection 80 mg    Orders Placed This Encounter  Procedures  . XR C-ARM NO REPORT  . Epidural Steroid injection    Follow-up: Return if symptoms worsen or fail to improve.   Procedures: No procedures performed  Lumbar Epidural Steroid Injection - Interlaminar Approach with Fluoroscopic Guidance  Patient: Tracy Frye      Date of Birth: 11-Oct-1955 MRN: [id number] PCP: Clovis Riley, L.August Saucer, MD      Visit Date: 07/02/2018   Universal Protocol:     Consent  Given By: the patient  Position: PRONE  Additional Comments: Vital signs were monitored before and after the procedure. Patient was prepped and draped in the usual sterile fashion. The correct patient, procedure, and site was verified.   Injection Procedure Details:  Procedure Site One Meds Administered:  Meds ordered this encounter  Medications  . methylPREDNISolone acetate (DEPO-MEDROL) injection 80 mg     Laterality: Right  Location/Site:  L4-L5  Needle size: 20 G  Needle type: Tuohy  Needle Placement: Paramedian epidural  Findings:   -Comments: Excellent flow of contrast into the epidural space.  Procedure Details: Using a paramedian approach from the side mentioned above, the region overlying the inferior lamina was localized under fluoroscopic visualization and the soft tissues overlying this structure were infiltrated with 4 ml. of 1% Lidocaine without Epinephrine. The Tuohy needle was inserted into the epidural space using a paramedian approach.   The epidural space was localized using loss of resistance along with lateral and bi-planar fluoroscopic views.  After negative aspirate for air, blood, and CSF, a 2 ml. volume of Isovue-250 was injected into the epidural space and the flow of contrast was observed. Radiographs were obtained for documentation purposes.    The injectate was administered into the level noted above.   Additional Comments:  The patient tolerated the procedure well Dressing: Band-Aid    Post-procedure details: Patient was observed during  the procedure. Post-procedure instructions were reviewed.  Patient left the clinic in stable condition.   Clinical History: MRI LUMBAR SPINE WITHOUT CONTRAST  TECHNIQUE: Multiplanar, multisequence MR imaging of the lumbar spine was performed. No intravenous contrast was administered.  COMPARISON:  Lumbar radiographs 01/15/2017.  FINDINGS: Segmentation: Normal lumbar segmentation demonstrated  on the comparison radiographs.  Alignment: Moderate dextroconvex lumbar scoliosis. Straightening of lumbar lordosis. Mild retrolisthesis at L4-L5 and L5-S1.  Vertebrae: Chronic degenerative endplate marrow signal changes at L4-L5. Background bone marrow signal is within normal limits. No marrow edema or evidence of acute osseous abnormality. Intact visible sacrum and SI joints.  Conus medullaris: Extends to the L1 level and appears normal.  Paraspinal and other soft tissues: Negative.  Disc levels:  T10-T11:  Partially visible mild disc bulge.  T11-12: Mild circumferential disc bulge. Mild to moderate facet hypertrophy. Mild left and moderate to severe right T11 foraminal stenosis.  T12-L1:  Negative.  L1-L2: Mild circumferential disc bulge with broad-based posterior component. Mild facet hypertrophy. Mild bilateral L1 foraminal stenosis.  L2-L3: Mild circumferential disc bulge. Mild facet hypertrophy. No stenosis.  L3-L4: Mild facet and ligament flavum hypertrophy greater on the right. Borderline to mild left L3 foraminal stenosis.  L4-L5: Severe disc space loss. Circumferential disc osteophyte complex with broad-based posterior component. Mild to moderate facet and ligament flavum hypertrophy. Borderline to mild spinal stenosis. Mild to moderate right lateral recess and right foraminal stenosis. Mild left foraminal stenosis.  L5-S1: Disc space loss. Mostly far lateral disc osteophyte complex. No spinal or lateral recess stenosis. Borderline to mild L5 bilateral foraminal stenosis.  IMPRESSION: 1. Dextroconvex lumbar scoliosis with straightening of lordosis and mild retrolisthesis at L4-L5 and L5-S1. 2. Advanced chronic L4-L5 and L5-S1 disc degeneration with endplate spurring. Up to mild spinal stenosis at L4-L5 associated with up to moderate right neural foraminal and right lateral recess stenosis at the right L4 and L5 nerve levels,  respectively. 3. Only mild neural foraminal stenosis at L5-S1. Intermittent mild lumbar foraminal stenosis elsewhere. 4. T11-T12 disc and advanced posterior element degeneration with up to severe right T11 foraminal stenosis.   Electronically Signed   By: Odessa Fleming M.D.   On: 03/25/2017 08:24     Objective:  VS:  HT:    WT:   BMI:     BP:122/88  HR:60bpm  TEMP:97.7 F (36.5 C)(Oral)  RESP:  Physical Exam  Ortho Exam Imaging: Xr C-arm No Report  Result Date: 07/02/2018 Please see Notes tab for imaging impression.

## 2018-08-06 ENCOUNTER — Encounter: Payer: Self-pay | Admitting: Sports Medicine

## 2018-08-06 ENCOUNTER — Ambulatory Visit (INDEPENDENT_AMBULATORY_CARE_PROVIDER_SITE_OTHER): Payer: Managed Care, Other (non HMO) | Admitting: Sports Medicine

## 2018-08-06 ENCOUNTER — Other Ambulatory Visit: Payer: Self-pay

## 2018-08-06 ENCOUNTER — Ambulatory Visit (INDEPENDENT_AMBULATORY_CARE_PROVIDER_SITE_OTHER): Payer: Managed Care, Other (non HMO)

## 2018-08-06 VITALS — BP 131/82 | HR 72 | Temp 98.6°F | Resp 16

## 2018-08-06 DIAGNOSIS — M25472 Effusion, left ankle: Secondary | ICD-10-CM | POA: Diagnosis not present

## 2018-08-06 DIAGNOSIS — M19072 Primary osteoarthritis, left ankle and foot: Secondary | ICD-10-CM | POA: Diagnosis not present

## 2018-08-06 DIAGNOSIS — M25572 Pain in left ankle and joints of left foot: Secondary | ICD-10-CM | POA: Diagnosis not present

## 2018-08-06 DIAGNOSIS — Z9889 Other specified postprocedural states: Secondary | ICD-10-CM | POA: Diagnosis not present

## 2018-08-06 DIAGNOSIS — G8929 Other chronic pain: Secondary | ICD-10-CM

## 2018-08-06 NOTE — Progress Notes (Signed)
Subjective: Tracy Frye is a 63 y.o. female patient seen today in office for POV # 9 (DOS 01-27-18), S/P left subtalar joint fusion. Patient reports that she is doing fine did have an episode of pain earlier this week for about half a day with pain on her heel that caused her to limp that was 8 out of 10 that went away when she iced.  Patient denies nausea vomiting fever chills or any other constitutional symptoms at this time.  No other issues noted.   Patient Active Problem List   Diagnosis Date Noted  . Chronic pain of left ankle 12/19/2015  . 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 aspirin EC 81 MG tablet Take 81 mg by mouth.    . cholestyramine (QUESTRAN) 4 g packet TAKE 1 PACKET (4 G TOTAL) BY MOUTH DAILY AS NEEDED FOR UP TO 30 DAYS.  9  . diazepam (VALIUM) 5 MG tablet Take 1-2 tablets (5-10 mg total) by mouth every 6 (six) hours as needed for muscle spasms. 60 tablet 0  . docusate sodium (COLACE) 100 MG capsule Take 1 capsule (100 mg total) by mouth 2 (two) times daily. 10 capsule 0  . HYDROcodone-acetaminophen (NORCO) 10-325 MG tablet Take 1 tablet by mouth every 8 (eight) hours as needed for severe pain. 20 tablet 0  . meloxicam (MOBIC) 15 MG tablet Take 1 tablet (15 mg total) by mouth daily. 30 tablet 2  . methocarbamol (ROBAXIN) 500 MG tablet Take 1 tablet (500 mg total) by mouth every 6 (six) hours as needed for muscle spasms. 30 tablet 2  . NONFORMULARY OR COMPOUNDED ITEM [facility]:  Combination Pain Cream - Baclofen 2%, doxepin 5%, Gabapentin 6%, Topiramate 2%, :Pentoxifylline 3%, apply 1-2 grams to affected area 3-4 times daily. 120 each 2  . predniSONE (STERAPRED UNI-PAK 21 TAB) 10 MG (21) TBPK tablet Take as directed 21 tablet 0  . promethazine (PHENERGAN) 25 MG tablet Take 1 tablet (25 mg total) by mouth every 8 (eight) hours as needed for nausea or vomiting. 20 tablet 0   . traMADol (ULTRAM) 50 MG tablet Take 1 tablet (50 mg total) by mouth every 8 (eight) hours as needed for moderate pain. 20 tablet 0   Current Facility-Administered Medications on File Prior to Visit  Medication Dose Route Frequency Provider Last Rate Last Dose  . dexamethasone (DECADRON) injection 4 mg  4 mg Intra-articular Once Delories Heinz, DPM      . triamcinolone acetonide (KENALOG) 10 MG/ML injection 10 mg  10 mg Other Once Asencion Islam, DPM      . triamcinolone acetonide (KENALOG) 10 MG/ML injection 10 mg  10 mg Other Once Asencion Islam, DPM      . triamcinolone acetonide (KENALOG) 10 MG/ML injection 10 mg  10 mg Other Once Asencion Islam, DPM      . triamcinolone acetonide (KENALOG) 10 MG/ML injection 10 mg  10 mg Other Once [place], Lukis Bunt, DPM      . triamcinolone acetonide (KENALOG-40) injection 20 mg  20 mg Other Once [place], Lorean Ekstrand, DPM      . triamcinolone acetonide (KENALOG-40) injection 20 mg  20 mg Other Once Asencion Islam, DPM        Allergies  Allergen Reactions  . Diclofenac Other (See Comments)    Chest pain  . Sulfa Antibiotics Rash    Objective: There were no vitals filed for  this visit.  General: No acute distress, AAOx3  Left foot: Surgical sites well-healed, no erythema, no warmth, no active drainage, no other signs of infection noted, Capillary fill time <3 seconds in all digits, trace swelling to lateral ankle, gross sensation present via light touch to left foot.  No pain or crepitation with range of motion left foot.  However patient did states that she did have an episode earlier this week of pain in her heel but now it is resolved she treated it with icing and it has been fine.  No pain today with palpation to plantar fascia however suspecting that she could have developed a little bit of inflammation along the tendon based on where she said her symptoms were.  No pain with calf compression.   X-ray left foot: Screws intact subtalar joint  sitting in optimal alignment with fusion noted with no acute findings  Assessment and Plan:  Problem List Items Addressed This Visit      Other   Chronic pain of left ankle   Relevant Orders   DG Foot Complete Left    Other Visit Diagnoses    Ankle swelling, left    -  Primary   Relevant Orders   DG Foot Complete Left   Post-operative state       Relevant Orders   DG Foot Complete Left   Arthritis of left foot       Relevant Orders   DG Foot Complete Left      -Patient seen and evaluated -X-rays reviewed -Advised patient to continue with compression sock to assist with edema control as needed -Advised patient to may resume all normal activities and shoes to tolerance -Advised patient states her activity levels are decreased because of the coronavirus should work at doing home exercises to prevent any type of flareup or limitations or stiffness around her foot and ankle -Advised patient in regards to her surgery anesthesia bill to contact [facility] regarding this matter however we will also reach out to her from our office billing to see if we can assist her in any way -Patient discharged from postoperative care to follow-up as needed. In the meantime, patient to call office if any issues or problems arise.   Asencion Islam, DPM

## 2019-01-21 ENCOUNTER — Other Ambulatory Visit: Payer: Self-pay | Admitting: Family Medicine

## 2019-01-21 DIAGNOSIS — Z1231 Encounter for screening mammogram for malignant neoplasm of breast: Secondary | ICD-10-CM

## 2019-02-20 ENCOUNTER — Other Ambulatory Visit (HOSPITAL_COMMUNITY)
Admission: RE | Admit: 2019-02-20 | Discharge: 2019-02-20 | Disposition: A | Discharge status: Home / Self Care | Payer: Managed Care, Other (non HMO) | Source: Ambulatory Visit | Attending: Family Medicine | Admitting: Family Medicine

## 2019-02-20 ENCOUNTER — Other Ambulatory Visit: Payer: Self-pay | Admitting: Family Medicine

## 2019-02-20 DIAGNOSIS — Z124 Encounter for screening for malignant neoplasm of cervix: Secondary | ICD-10-CM | POA: Diagnosis present

## 2019-02-24 ENCOUNTER — Other Ambulatory Visit: Payer: Self-pay | Admitting: Family Medicine

## 2019-02-24 DIAGNOSIS — M8580 Other specified disorders of bone density and structure, unspecified site: Secondary | ICD-10-CM

## 2019-02-26 ENCOUNTER — Other Ambulatory Visit: Payer: Self-pay

## 2019-02-26 ENCOUNTER — Ambulatory Visit
Admission: RE | Admit: 2019-02-26 | Discharge: 2019-02-26 | Disposition: A | Discharge status: Home / Self Care | Payer: Managed Care, Other (non HMO) | Source: Ambulatory Visit | Attending: Family Medicine | Admitting: Family Medicine

## 2019-02-26 DIAGNOSIS — M8580 Other specified disorders of bone density and structure, unspecified site: Secondary | ICD-10-CM

## 2019-02-27 LAB — CYTOLOGY - PAP
Diagnosis: NEGATIVE
High risk HPV: NEGATIVE

## 2019-03-11 ENCOUNTER — Other Ambulatory Visit: Payer: Self-pay

## 2019-03-11 ENCOUNTER — Other Ambulatory Visit: Payer: Self-pay | Admitting: Family Medicine

## 2019-03-11 ENCOUNTER — Ambulatory Visit
Admission: RE | Admit: 2019-03-11 | Discharge: 2019-03-11 | Disposition: A | Discharge status: Home / Self Care | Payer: Managed Care, Other (non HMO) | Source: Ambulatory Visit | Attending: Family Medicine | Admitting: Family Medicine

## 2019-03-11 DIAGNOSIS — Z1231 Encounter for screening mammogram for malignant neoplasm of breast: Secondary | ICD-10-CM

## 2019-08-04 ENCOUNTER — Telehealth: Payer: Self-pay | Admitting: Physical Medicine and Rehabilitation

## 2019-08-04 NOTE — Telephone Encounter (Signed)
Okay for an injection and we can do a quick eval first.  This seemed to happen the last time we saw her.  If any trauma etc. let me know.

## 2019-08-05 NOTE — Telephone Encounter (Signed)
Left message #1 to schedule repeat L4-5 IL with eval.

## 2019-08-06 NOTE — Telephone Encounter (Signed)
No pa is needed for [id number] per [employer] representative. Reference# [id number] [email or web address]

## 2019-08-06 NOTE — Telephone Encounter (Signed)
Needs auth for [id number]. Scheduled for 4/6 with driver and no blood thinners.

## 2019-08-25 ENCOUNTER — Encounter: Payer: Self-pay | Admitting: Physical Medicine and Rehabilitation

## 2019-08-25 ENCOUNTER — Other Ambulatory Visit: Payer: Self-pay

## 2019-08-25 ENCOUNTER — Ambulatory Visit: Payer: Self-pay

## 2019-08-25 ENCOUNTER — Ambulatory Visit (INDEPENDENT_AMBULATORY_CARE_PROVIDER_SITE_OTHER): Payer: Managed Care, Other (non HMO) | Admitting: Physical Medicine and Rehabilitation

## 2019-08-25 VITALS — BP 144/85 | HR 66 | Ht 67.5 in | Wt 182.0 lb

## 2019-08-25 DIAGNOSIS — M5416 Radiculopathy, lumbar region: Secondary | ICD-10-CM

## 2019-08-25 MED ORDER — METHYLPREDNISOLONE ACETATE 80 MG/ML IJ SUSP
40.0000 mg | Freq: Once | INTRAMUSCULAR | Status: AC
  Administered 2019-08-25: 10:00:00 40 mg

## 2019-08-25 NOTE — Progress Notes (Signed)
Tracy Frye - 64 y.o. female MRN [id number]  Date of birth: 1955/05/23  Office Visit Note: Visit Date: 08/25/2019 PCP: Sigmund Hazel, MD Referred by: Sigmund Hazel, MD  Subjective: Chief Complaint  Patient presents with  . Lower Back - Pain  . Right Leg - Pain   HPI:  Tracy Frye is a 64 y.o. female who comes in today For planned repeat right L4-5 interlaminar dural steroid injection.  Patient had good relief from prior right L4-5 interlaminar injection in February 2020.  She got about 6 months of really good relief and then symptoms did start to return.  She has had no trauma or weakness or other red flag complaints.  She waited until now to come back in because she wanted to get both of her coronavirus vaccine injections.  She has had that done now and is complaining of very similar pain.  Exam does not show any focal weakness.  We talked about injections over time and the standards that are employed from a medical standpoint insurance standpoint at times.  We are going to repeat this today we will see how she does.  ROS Otherwise per HPI.  Assessment & Plan: Visit Diagnoses:  1. Lumbar radiculopathy     Plan: No additional findings.   Meds & Orders:  Meds ordered this encounter  Medications  . methylPREDNISolone acetate (DEPO-MEDROL) injection 40 mg    Orders Placed This Encounter  Procedures  . XR C-ARM NO REPORT  . Epidural Steroid injection    Follow-up: Return if symptoms worsen or fail to improve.   Procedures: No procedures performed  Lumbar Epidural Steroid Injection - Interlaminar Approach with Fluoroscopic Guidance  Patient: Tracy Frye      Date of Birth: 11/29/1955 MRN: [id number] PCP: Sigmund Hazel, MD      Visit Date: 08/25/2019   Universal Protocol:     Consent Given By: the patient  Position: PRONE  Additional Comments: Vital signs were monitored before and after the procedure. Patient was prepped and draped in the usual  sterile fashion. The correct patient, procedure, and site was verified.   Injection Procedure Details:  Procedure Site One Meds Administered:  Meds ordered this encounter  Medications  . methylPREDNISolone acetate (DEPO-MEDROL) injection 40 mg     Laterality: Right  Location/Site:  L4-L5  Needle size: 20 G  Needle type: Tuohy  Needle Placement: Paramedian epidural  Findings:   -Comments: Excellent flow of contrast into the epidural space.  Procedure Details: Using a paramedian approach from the side mentioned above, the region overlying the inferior lamina was localized under fluoroscopic visualization and the soft tissues overlying this structure were infiltrated with 4 ml. of 1% Lidocaine without Epinephrine. The Tuohy needle was inserted into the epidural space using a paramedian approach.   The epidural space was localized using loss of resistance along with lateral and bi-planar fluoroscopic views.  After negative aspirate for air, blood, and CSF, a 2 ml. volume of Isovue-250 was injected into the epidural space and the flow of contrast was observed. Radiographs were obtained for documentation purposes.    The injectate was administered into the level noted above.   Additional Comments:  The patient tolerated the procedure well Dressing: 2 x 2 sterile gauze and Band-Aid    Post-procedure details: Patient was observed during the procedure. Post-procedure instructions were reviewed.  Patient left the clinic in stable condition.    Clinical History: MRI LUMBAR SPINE WITHOUT CONTRAST  TECHNIQUE: Multiplanar, multisequence  MR imaging of the lumbar spine was performed. No intravenous contrast was administered.  COMPARISON:  Lumbar radiographs 01/15/2017.  FINDINGS: Segmentation: Normal lumbar segmentation demonstrated on the comparison radiographs.  Alignment: Moderate dextroconvex lumbar scoliosis. Straightening of lumbar lordosis. Mild retrolisthesis  at L4-L5 and L5-S1.  Vertebrae: Chronic degenerative endplate marrow signal changes at L4-L5. Background bone marrow signal is within normal limits. No marrow edema or evidence of acute osseous abnormality. Intact visible sacrum and SI joints.  Conus medullaris: Extends to the L1 level and appears normal.  Paraspinal and other soft tissues: Negative.  Disc levels:  T10-T11:  Partially visible mild disc bulge.  T11-12: Mild circumferential disc bulge. Mild to moderate facet hypertrophy. Mild left and moderate to severe right T11 foraminal stenosis.  T12-L1:  Negative.  L1-L2: Mild circumferential disc bulge with broad-based posterior component. Mild facet hypertrophy. Mild bilateral L1 foraminal stenosis.  L2-L3: Mild circumferential disc bulge. Mild facet hypertrophy. No stenosis.  L3-L4: Mild facet and ligament flavum hypertrophy greater on the right. Borderline to mild left L3 foraminal stenosis.  L4-L5: Severe disc space loss. Circumferential disc osteophyte complex with broad-based posterior component. Mild to moderate facet and ligament flavum hypertrophy. Borderline to mild spinal stenosis. Mild to moderate right lateral recess and right foraminal stenosis. Mild left foraminal stenosis.  L5-S1: Disc space loss. Mostly far lateral disc osteophyte complex. No spinal or lateral recess stenosis. Borderline to mild L5 bilateral foraminal stenosis.  IMPRESSION: 1. Dextroconvex lumbar scoliosis with straightening of lordosis and mild retrolisthesis at L4-L5 and L5-S1. 2. Advanced chronic L4-L5 and L5-S1 disc degeneration with endplate spurring. Up to mild spinal stenosis at L4-L5 associated with up to moderate right neural foraminal and right lateral recess stenosis at the right L4 and L5 nerve levels, respectively. 3. Only mild neural foraminal stenosis at L5-S1. Intermittent mild lumbar foraminal stenosis elsewhere. 4. T11-T12 disc and advanced  posterior element degeneration with up to severe right T11 foraminal stenosis.   Electronically Signed   By: Genevie Ann M.D.   On: 03/25/2017 08:24     Objective:  VS:  HT:5' 7.5" (171.5 cm)   WT:182 lb (82.6 kg)  BMI:28.07    BP:(!) 144/85  HR:66bpm  TEMP: ( )  RESP:  Physical Exam  Ortho Exam Imaging: XR C-ARM NO REPORT  Result Date: 08/25/2019 Please see Notes tab for imaging impression.

## 2019-08-25 NOTE — Procedures (Signed)
Lumbar Epidural Steroid Injection - Interlaminar Approach with Fluoroscopic Guidance  Patient: Tracy Frye      Date of Birth: 1955-07-25 MRN: [id number] PCP: Sigmund Hazel, MD      Visit Date: 08/25/2019   Universal Protocol:     Consent Given By: the patient  Position: PRONE  Additional Comments: Vital signs were monitored before and after the procedure. Patient was prepped and draped in the usual sterile fashion. The correct patient, procedure, and site was verified.   Injection Procedure Details:  Procedure Site One Meds Administered:  Meds ordered this encounter  Medications  . methylPREDNISolone acetate (DEPO-MEDROL) injection 40 mg     Laterality: Right  Location/Site:  L4-L5  Needle size: 20 G  Needle type: Tuohy  Needle Placement: Paramedian epidural  Findings:   -Comments: Excellent flow of contrast into the epidural space.  Procedure Details: Using a paramedian approach from the side mentioned above, the region overlying the inferior lamina was localized under fluoroscopic visualization and the soft tissues overlying this structure were infiltrated with 4 ml. of 1% Lidocaine without Epinephrine. The Tuohy needle was inserted into the epidural space using a paramedian approach.   The epidural space was localized using loss of resistance along with lateral and bi-planar fluoroscopic views.  After negative aspirate for air, blood, and CSF, a 2 ml. volume of Isovue-250 was injected into the epidural space and the flow of contrast was observed. Radiographs were obtained for documentation purposes.    The injectate was administered into the level noted above.   Additional Comments:  The patient tolerated the procedure well Dressing: 2 x 2 sterile gauze and Band-Aid    Post-procedure details: Patient was observed during the procedure. Post-procedure instructions were reviewed.  Patient left the clinic in stable condition.

## 2019-08-25 NOTE — Progress Notes (Signed)
 .  Numeric Pain Rating Scale and Functional Assessment Average Pain 6 Pain Right Now 8 My pain is intermittent and aching Pain is worse with: sitting and laying down Pain improves with: heat/ice and therapy/exercise   In the last MONTH (on 0-10 scale) has pain interfered with the following?  1. General activity like being  able to carry out your everyday physical activities such as walking, climbing stairs, carrying groceries, or moving a chair?  Rating(9)  2. Relation with others like being able to carry out your usual social activities and roles such as  activities at home, at work and in your community. Rating(9)  3. Enjoyment of life such that you have  been bothered by emotional problems such as feeling anxious, depressed or irritable?  Rating(3)   +Driver, -BT, -Dye Allergies.

## 2019-12-24 ENCOUNTER — Telehealth: Payer: Self-pay | Admitting: Physical Medicine and Rehabilitation

## 2019-12-24 NOTE — Telephone Encounter (Signed)
Patient called needing to schedule an appointment with Dr Alvester Morin for her lower back. The number to contact patient is [phone]

## 2019-12-24 NOTE — Telephone Encounter (Signed)
Right L4-5 IL on 08/25/19. Ok to repeat if helped, same problem/side, and no new injury?

## 2019-12-24 NOTE — Telephone Encounter (Signed)
Needs auth and scheduling for [id number]. No blood thinners.

## 2019-12-24 NOTE — Telephone Encounter (Signed)
Ok if criteria met otherwise OV me or Xu versus MRI

## 2019-12-25 NOTE — Telephone Encounter (Signed)
Pt Not Required Auth # [id number], pt can be sch.

## 2019-12-28 NOTE — Telephone Encounter (Signed)
Scheduled for 8/18 at 1345 with driver.

## 2020-01-06 ENCOUNTER — Ambulatory Visit: Payer: Self-pay

## 2020-01-06 ENCOUNTER — Ambulatory Visit (INDEPENDENT_AMBULATORY_CARE_PROVIDER_SITE_OTHER): Payer: Managed Care, Other (non HMO) | Admitting: Physical Medicine and Rehabilitation

## 2020-01-06 ENCOUNTER — Encounter: Payer: Self-pay | Admitting: Physical Medicine and Rehabilitation

## 2020-01-06 ENCOUNTER — Other Ambulatory Visit: Payer: Self-pay

## 2020-01-06 VITALS — BP 125/81 | HR 61

## 2020-01-06 DIAGNOSIS — M5416 Radiculopathy, lumbar region: Secondary | ICD-10-CM

## 2020-01-06 MED ORDER — METHYLPREDNISOLONE ACETATE 80 MG/ML IJ SUSP
80.0000 mg | Freq: Once | INTRAMUSCULAR | Status: AC
Start: 2020-01-06 — End: 2020-01-06
  Administered 2020-01-06: 80 mg

## 2020-01-06 NOTE — Progress Notes (Signed)
Pt state center of her lower back has been giving her pain. Pt states laying makes the pain worse. Pt state walking and swimming helps with pain relief. Pt has hx of inj on 08/25/19 pt state it did well but pt has been caring for a grandson that she has to pick up once in a while.  Numeric Pain Rating Scale and Functional Assessment Average Pain 6   In the last MONTH (on 0-10 scale) has pain interfered with the following?  1. General activity like being  able to carry out your everyday physical activities such as walking, climbing stairs, carrying groceries, or moving a chair?  Rating(9)   +Driver, -BT, -Dye Allergies.

## 2020-01-14 NOTE — Progress Notes (Signed)
Tracy Frye - 64 y.o. female MRN [id number]  Date of birth: July 03, 1955  Office Visit Note: Visit Date: 01/06/2020 PCP: Sigmund Hazel, MD Referred by: Sigmund Hazel, MD  Subjective: Chief Complaint  Patient presents with  . Lower Back - Pain   HPI:  Tracy Frye is a 64 y.o. female who comes in today for planned repeat Right L4-L5 Lumbar epidural steroid injection with fluoroscopic guidance.  The patient has failed conservative care including home exercise, medications, time and activity modification.  This injection will be diagnostic and hopefully therapeutic.  Please see requesting physician notes for further details and justification. Patient received more than 50% pain relief from prior injection.   Referring: Dr. Glee Arvin  MRI reviewed with images and spine model.  MRI reviewed in the note below.   Discussed with patient today that she has had 2 prior injections that both helped for about 4 months.  This is doing fairly well in terms of injections if they were beneficial enough.  This is not something that would her her long-term.  Alternatively at some point could look at diagnostic medial branch blocks of the lower facet joints to see diagnostically how much of her back pain is facet mediated.  Could perform double diagnostic blocks with goal towards radiofrequency ablation.   ROS Otherwise per HPI.  Assessment & Plan: Visit Diagnoses:  1. Lumbar radiculopathy     Plan: No additional findings.   Meds & Orders:  Meds ordered this encounter  Medications  . methylPREDNISolone acetate (DEPO-MEDROL) injection 80 mg    Orders Placed This Encounter  Procedures  . XR C-ARM NO REPORT  . Epidural Steroid injection    Follow-up: Return if symptoms worsen or fail to improve.   Procedures: No procedures performed  No notes on file   Clinical History: MRI LUMBAR SPINE WITHOUT CONTRAST  TECHNIQUE: Multiplanar, multisequence MR imaging of the lumbar spine  was performed. No intravenous contrast was administered.  COMPARISON:  Lumbar radiographs 01/15/2017.  FINDINGS: Segmentation: Normal lumbar segmentation demonstrated on the comparison radiographs.  Alignment: Moderate dextroconvex lumbar scoliosis. Straightening of lumbar lordosis. Mild retrolisthesis at L4-L5 and L5-S1.  Vertebrae: Chronic degenerative endplate marrow signal changes at L4-L5. Background bone marrow signal is within normal limits. No marrow edema or evidence of acute osseous abnormality. Intact visible sacrum and SI joints.  Conus medullaris: Extends to the L1 level and appears normal.  Paraspinal and other soft tissues: Negative.  Disc levels:  T10-T11:  Partially visible mild disc bulge.  T11-12: Mild circumferential disc bulge. Mild to moderate facet hypertrophy. Mild left and moderate to severe right T11 foraminal stenosis.  T12-L1:  Negative.  L1-L2: Mild circumferential disc bulge with broad-based posterior component. Mild facet hypertrophy. Mild bilateral L1 foraminal stenosis.  L2-L3: Mild circumferential disc bulge. Mild facet hypertrophy. No stenosis.  L3-L4: Mild facet and ligament flavum hypertrophy greater on the right. Borderline to mild left L3 foraminal stenosis.  L4-L5: Severe disc space loss. Circumferential disc osteophyte complex with broad-based posterior component. Mild to moderate facet and ligament flavum hypertrophy. Borderline to mild spinal stenosis. Mild to moderate right lateral recess and right foraminal stenosis. Mild left foraminal stenosis.  L5-S1: Disc space loss. Mostly far lateral disc osteophyte complex. No spinal or lateral recess stenosis. Borderline to mild L5 bilateral foraminal stenosis.  IMPRESSION: 1. Dextroconvex lumbar scoliosis with straightening of lordosis and mild retrolisthesis at L4-L5 and L5-S1. 2. Advanced chronic L4-L5 and L5-S1 disc degeneration with endplate spurring. Up  to mild spinal stenosis at L4-L5 associated with up to moderate right neural foraminal and right lateral recess stenosis at the right L4 and L5 nerve levels, respectively. 3. Only mild neural foraminal stenosis at L5-S1. Intermittent mild lumbar foraminal stenosis elsewhere. 4. T11-T12 disc and advanced posterior element degeneration with up to severe right T11 foraminal stenosis.   Electronically Signed   By: Odessa Fleming M.D.   On: 03/25/2017 08:24     Objective:  VS:  HT:    WT:   BMI:     BP:125/81  HR:61bpm  TEMP: ( )  RESP:  Physical Exam Constitutional:      General: She is not in acute distress.    Appearance: Normal appearance. She is not ill-appearing.  HENT:     Head: Normocephalic and atraumatic.     Right Ear: External ear normal.     Left Ear: External ear normal.  Eyes:     Extraocular Movements: Extraocular movements intact.  Cardiovascular:     Rate and Rhythm: Normal rate.     Pulses: Normal pulses.  Musculoskeletal:     Right lower leg: No edema.     Left lower leg: No edema.     Comments: Patient has good distal strength with no pain over the greater trochanters.  No clonus or focal weakness. Patient somewhat slow to rise from a seated position to full extension.  There is concordant low back pain with facet loading and lumbar spine extension rotation.  There are no definitive trigger points but the patient is somewhat tender across the lower back and PSIS.  There is no pain with hip rotation.   Skin:    Findings: No erythema, lesion or rash.  Neurological:     General: No focal deficit present.     Mental Status: She is alert and oriented to person, place, and time.     Sensory: No sensory deficit.     Motor: No weakness or abnormal muscle tone.     Coordination: Coordination normal.  Psychiatric:        Mood and Affect: Mood normal.        Behavior: Behavior normal.      Imaging: No results found.

## 2020-01-26 ENCOUNTER — Other Ambulatory Visit: Payer: Self-pay | Admitting: Family Medicine

## 2020-01-26 DIAGNOSIS — Z1231 Encounter for screening mammogram for malignant neoplasm of breast: Secondary | ICD-10-CM

## 2020-02-26 DIAGNOSIS — L818 Other specified disorders of pigmentation: Secondary | ICD-10-CM | POA: Insufficient documentation

## 2020-03-11 ENCOUNTER — Ambulatory Visit: Payer: Managed Care, Other (non HMO)

## 2020-03-15 ENCOUNTER — Other Ambulatory Visit: Payer: Self-pay | Admitting: Family Medicine

## 2020-03-15 DIAGNOSIS — E785 Hyperlipidemia, unspecified: Secondary | ICD-10-CM

## 2020-03-29 ENCOUNTER — Ambulatory Visit
Admission: RE | Admit: 2020-03-29 | Discharge: 2020-03-29 | Disposition: A | Discharge status: Home / Self Care | Payer: No Typology Code available for payment source | Source: Ambulatory Visit | Attending: Family Medicine | Admitting: Family Medicine

## 2020-03-29 ENCOUNTER — Ambulatory Visit
Admission: RE | Admit: 2020-03-29 | Discharge: 2020-03-29 | Disposition: A | Discharge status: Home / Self Care | Payer: Medicare Other | Source: Ambulatory Visit | Attending: Family Medicine | Admitting: Family Medicine

## 2020-03-29 ENCOUNTER — Other Ambulatory Visit: Payer: Self-pay

## 2020-03-29 DIAGNOSIS — E785 Hyperlipidemia, unspecified: Secondary | ICD-10-CM

## 2020-03-29 DIAGNOSIS — Z1231 Encounter for screening mammogram for malignant neoplasm of breast: Secondary | ICD-10-CM

## 2020-05-06 DIAGNOSIS — L988 Other specified disorders of the skin and subcutaneous tissue: Secondary | ICD-10-CM | POA: Insufficient documentation

## 2020-07-18 ENCOUNTER — Telehealth: Payer: Self-pay | Admitting: Physical Medicine and Rehabilitation

## 2020-07-18 NOTE — Telephone Encounter (Signed)
Is auth needed? Scheduled for 3/9 with driver and no blood thinners.

## 2020-07-18 NOTE — Telephone Encounter (Signed)
Patient called. She would like an appointment with Dr. Alvester Morin. Her call back number is [phone]

## 2020-07-18 NOTE — Telephone Encounter (Signed)
ok 

## 2020-07-18 NOTE — Telephone Encounter (Signed)
Right L4-5 IL on 01/06/20. Ok to repeat if helped, same problem/side, and no new injury?

## 2020-07-20 NOTE — Telephone Encounter (Signed)
Pt not req Auth Ref# [id number]

## 2020-07-27 ENCOUNTER — Ambulatory Visit (INDEPENDENT_AMBULATORY_CARE_PROVIDER_SITE_OTHER): Payer: Managed Care, Other (non HMO) | Admitting: Physical Medicine and Rehabilitation

## 2020-07-27 ENCOUNTER — Encounter: Payer: Self-pay | Admitting: Physical Medicine and Rehabilitation

## 2020-07-27 ENCOUNTER — Ambulatory Visit: Payer: Self-pay

## 2020-07-27 ENCOUNTER — Other Ambulatory Visit: Payer: Self-pay

## 2020-07-27 VITALS — BP 127/83 | HR 75

## 2020-07-27 DIAGNOSIS — M5416 Radiculopathy, lumbar region: Secondary | ICD-10-CM | POA: Diagnosis not present

## 2020-07-27 MED ORDER — BETAMETHASONE SOD PHOS & ACET 6 (3-3) MG/ML IJ SUSP
12.0000 mg | Freq: Once | INTRAMUSCULAR | Status: AC
  Administered 2020-07-27: 12 mg

## 2020-07-27 NOTE — Progress Notes (Signed)
Pt state right lower back pain that travels down her right leg to the knee. Pt state sitting for a long time makes the pain worse. Pt state she take over the counter pain meds and heating pads to help ease the pain. Pt has hx of inj on 01/06/20 pt state it helped with 100% relief for six months.  Numeric Pain Rating Scale and Functional Assessment Average Pain 8   In the last MONTH (on 0-10 scale) has pain interfered with the following?  1. General activity like being  able to carry out your everyday physical activities such as walking, climbing stairs, carrying groceries, or moving a chair?  Rating(10)   +Driver, -BT, -Dye Allergies.

## 2020-07-27 NOTE — Patient Instructions (Signed)

## 2020-07-27 NOTE — Progress Notes (Signed)
Tracy Frye - 65 y.o. female MRN [id number]  Date of birth: 1955-05-22  Office Visit Note: Visit Date: 07/27/2020 PCP: Sigmund Hazel, MD Referred by: Sigmund Hazel, MD  Subjective: Chief Complaint  Patient presents with  . Lower Back - Pain  . Right Knee - Pain   HPI:  Tracy Frye is a 65 y.o. female who comes in today for planned repeat Right L4-L5 Lumbar epidural steroid injection with fluoroscopic guidance.  The patient has failed conservative care including home exercise, medications, time and activity modification.  This injection will be diagnostic and hopefully therapeutic.  Please see requesting physician notes for further details and justification. Patient received more than 100% pain relief from prior injection.    Referring: Dr. Glee Arvin    ROS Otherwise per HPI.  Assessment & Plan: Visit Diagnoses:    ICD-10-CM   1. Lumbar radiculopathy  M54.16 XR C-ARM NO REPORT    Epidural Steroid injection    betamethasone acetate-betamethasone sodium phosphate (CELESTONE) injection 12 mg    Plan: No additional findings.   Meds & Orders:  Meds ordered this encounter  Medications  . betamethasone acetate-betamethasone sodium phosphate (CELESTONE) injection 12 mg    Orders Placed This Encounter  Procedures  . XR C-ARM NO REPORT  . Epidural Steroid injection    Follow-up: Return if symptoms worsen or fail to improve.   Procedures: No procedures performed  Lumbar Epidural Steroid Injection - Interlaminar Approach with Fluoroscopic Guidance  Patient: Tracy Frye      Date of Birth: 09/07/1955 MRN: [id number] PCP: Sigmund Hazel, MD      Visit Date: 07/27/2020   Universal Protocol:     Consent Given By: the patient  Position: PRONE  Additional Comments: Vital signs were monitored before and after the procedure. Patient was prepped and draped in the usual sterile fashion. The correct patient, procedure, and site was verified.   Injection  Procedure Details:   Procedure diagnoses: Lumbar radiculopathy [M54.16]   Meds Administered:  Meds ordered this encounter  Medications  . betamethasone acetate-betamethasone sodium phosphate (CELESTONE) injection 12 mg     Laterality: Right  Location/Site:  L4-L5  Needle: 3.5 in., 20 ga. Tuohy  Needle Placement: Paramedian epidural  Findings:   -Comments: Excellent flow of contrast into the epidural space.  Procedure Details: Using a paramedian approach from the side mentioned above, the region overlying the inferior lamina was localized under fluoroscopic visualization and the soft tissues overlying this structure were infiltrated with 4 ml. of 1% Lidocaine without Epinephrine. The Tuohy needle was inserted into the epidural space using a paramedian approach.   The epidural space was localized using loss of resistance along with counter oblique bi-planar fluoroscopic views.  After negative aspirate for air, blood, and CSF, a 2 ml. volume of Isovue-250 was injected into the epidural space and the flow of contrast was observed. Radiographs were obtained for documentation purposes.    The injectate was administered into the level noted above.   Additional Comments:  The patient tolerated the procedure well Dressing: 2 x 2 sterile gauze and Band-Aid    Post-procedure details: Patient was observed during the procedure. Post-procedure instructions were reviewed.  Patient left the clinic in stable condition.     Clinical History: MRI LUMBAR SPINE WITHOUT CONTRAST  TECHNIQUE: Multiplanar, multisequence MR imaging of the lumbar spine was performed. No intravenous contrast was administered.  COMPARISON:  Lumbar radiographs 01/15/2017.  FINDINGS: Segmentation: Normal lumbar segmentation demonstrated on the comparison  radiographs.  Alignment: Moderate dextroconvex lumbar scoliosis. Straightening of lumbar lordosis. Mild retrolisthesis at L4-L5 and  L5-S1.  Vertebrae: Chronic degenerative endplate marrow signal changes at L4-L5. Background bone marrow signal is within normal limits. No marrow edema or evidence of acute osseous abnormality. Intact visible sacrum and SI joints.  Conus medullaris: Extends to the L1 level and appears normal.  Paraspinal and other soft tissues: Negative.  Disc levels:  T10-T11:  Partially visible mild disc bulge.  T11-12: Mild circumferential disc bulge. Mild to moderate facet hypertrophy. Mild left and moderate to severe right T11 foraminal stenosis.  T12-L1:  Negative.  L1-L2: Mild circumferential disc bulge with broad-based posterior component. Mild facet hypertrophy. Mild bilateral L1 foraminal stenosis.  L2-L3: Mild circumferential disc bulge. Mild facet hypertrophy. No stenosis.  L3-L4: Mild facet and ligament flavum hypertrophy greater on the right. Borderline to mild left L3 foraminal stenosis.  L4-L5: Severe disc space loss. Circumferential disc osteophyte complex with broad-based posterior component. Mild to moderate facet and ligament flavum hypertrophy. Borderline to mild spinal stenosis. Mild to moderate right lateral recess and right foraminal stenosis. Mild left foraminal stenosis.  L5-S1: Disc space loss. Mostly far lateral disc osteophyte complex. No spinal or lateral recess stenosis. Borderline to mild L5 bilateral foraminal stenosis.  IMPRESSION: 1. Dextroconvex lumbar scoliosis with straightening of lordosis and mild retrolisthesis at L4-L5 and L5-S1. 2. Advanced chronic L4-L5 and L5-S1 disc degeneration with endplate spurring. Up to mild spinal stenosis at L4-L5 associated with up to moderate right neural foraminal and right lateral recess stenosis at the right L4 and L5 nerve levels, respectively. 3. Only mild neural foraminal stenosis at L5-S1. Intermittent mild lumbar foraminal stenosis elsewhere. 4. T11-T12 disc and advanced posterior element  degeneration with up to severe right T11 foraminal stenosis.   Electronically Signed   By: Odessa Fleming M.D.   On: 03/25/2017 08:24     Objective:  VS:  HT:    WT:   BMI:     BP:127/83  HR:75bpm  TEMP: ( )  RESP:  Physical Exam Vitals and nursing note reviewed.  Constitutional:      General: She is not in acute distress.    Appearance: Normal appearance. She is obese. She is not ill-appearing.  HENT:     Head: Normocephalic and atraumatic.     Right Ear: External ear normal.     Left Ear: External ear normal.  Eyes:     Extraocular Movements: Extraocular movements intact.  Cardiovascular:     Rate and Rhythm: Normal rate.     Pulses: Normal pulses.  Pulmonary:     Effort: Pulmonary effort is normal. No respiratory distress.  Abdominal:     General: There is no distension.     Palpations: Abdomen is soft.  Musculoskeletal:        General: Tenderness present.     Cervical back: Neck supple.     Right lower leg: No edema.     Left lower leg: No edema.     Comments: Patient has good distal strength with no pain over the greater trochanters.  No clonus or focal weakness.  Skin:    Findings: No erythema, lesion or rash.  Neurological:     General: No focal deficit present.     Mental Status: She is alert and oriented to person, place, and time.     Sensory: No sensory deficit.     Motor: No weakness or abnormal muscle tone.     Coordination: Coordination  normal.  Psychiatric:        Mood and Affect: Mood normal.        Behavior: Behavior normal.      Imaging: No results found.

## 2020-08-23 NOTE — Procedures (Signed)
Lumbar Epidural Steroid Injection - Interlaminar Approach with Fluoroscopic Guidance  Patient: Tracy Frye      Date of Birth: 04/27/1956 MRN: [id number] PCP: Sigmund Hazel, MD      Visit Date: 07/27/2020   Universal Protocol:     Consent Given By: the patient  Position: PRONE  Additional Comments: Vital signs were monitored before and after the procedure. Patient was prepped and draped in the usual sterile fashion. The correct patient, procedure, and site was verified.   Injection Procedure Details:   Procedure diagnoses: Lumbar radiculopathy [M54.16]   Meds Administered:  Meds ordered this encounter  Medications  . betamethasone acetate-betamethasone sodium phosphate (CELESTONE) injection 12 mg     Laterality: Right  Location/Site:  L4-L5  Needle: 3.5 in., 20 ga. Tuohy  Needle Placement: Paramedian epidural  Findings:   -Comments: Excellent flow of contrast into the epidural space.  Procedure Details: Using a paramedian approach from the side mentioned above, the region overlying the inferior lamina was localized under fluoroscopic visualization and the soft tissues overlying this structure were infiltrated with 4 ml. of 1% Lidocaine without Epinephrine. The Tuohy needle was inserted into the epidural space using a paramedian approach.   The epidural space was localized using loss of resistance along with counter oblique bi-planar fluoroscopic views.  After negative aspirate for air, blood, and CSF, a 2 ml. volume of Isovue-250 was injected into the epidural space and the flow of contrast was observed. Radiographs were obtained for documentation purposes.    The injectate was administered into the level noted above.   Additional Comments:  The patient tolerated the procedure well Dressing: 2 x 2 sterile gauze and Band-Aid    Post-procedure details: Patient was observed during the procedure. Post-procedure instructions were reviewed.  Patient left the  clinic in stable condition.

## 2021-02-27 ENCOUNTER — Other Ambulatory Visit: Payer: Self-pay | Admitting: Family Medicine

## 2021-02-27 DIAGNOSIS — Z1231 Encounter for screening mammogram for malignant neoplasm of breast: Secondary | ICD-10-CM

## 2021-03-06 ENCOUNTER — Telehealth: Payer: Self-pay | Admitting: Physical Medicine and Rehabilitation

## 2021-03-06 DIAGNOSIS — M5416 Radiculopathy, lumbar region: Secondary | ICD-10-CM

## 2021-03-06 NOTE — Telephone Encounter (Signed)
Patient is requesting a prescription to help with muscle spasms while awaiting insurance auth. Please advise.

## 2021-03-06 NOTE — Telephone Encounter (Signed)
Right L4-5 IL on 07/27/2020. Ok to repeat if helped, same problem/side, and no new injury?

## 2021-03-06 NOTE — Addendum Note (Signed)
Addended by: Cindie Crumbly B on: 03/06/2021 12:30 PM   Modules accepted: Orders

## 2021-03-06 NOTE — Telephone Encounter (Signed)
Pt called to get scheduled for another injection. Right L4-5 IL   CB [phone]

## 2021-03-07 ENCOUNTER — Telehealth: Payer: Self-pay | Admitting: Physical Medicine and Rehabilitation

## 2021-03-07 MED ORDER — METHOCARBAMOL 500 MG PO TABS: 500.0000 mg | ORAL_TABLET | Freq: Four times a day (QID) | ORAL | 2 refills | Status: DC | PRN

## 2021-03-07 NOTE — Telephone Encounter (Signed)
I tried to look at this and it looks like maybe it went through as a fax instead of electronically. I do not see a pharmacy associated with the order.

## 2021-03-07 NOTE — Addendum Note (Signed)
Addended by: Ashok Norris on: 03/07/2021 09:23 AM   Modules accepted: Orders

## 2021-03-07 NOTE — Telephone Encounter (Signed)
Called patient to advise that prescription was sent again to [facility] in [facility].

## 2021-03-07 NOTE — Addendum Note (Signed)
Addended by: Ashok Norris on: 03/07/2021 01:21 PM   Modules accepted: Orders

## 2021-03-07 NOTE — Telephone Encounter (Signed)
Pt called and muscle relaxer was not at her pharmacy.   [facility] [address] [place], [place] [address]  CB [phone]

## 2021-03-13 ENCOUNTER — Telehealth: Payer: Self-pay | Admitting: Physical Medicine and Rehabilitation

## 2021-03-13 NOTE — Telephone Encounter (Signed)
Patient called. She would like to speak with [facility]. Her call back number is [phone]

## 2021-03-27 ENCOUNTER — Other Ambulatory Visit: Payer: Self-pay | Admitting: Family Medicine

## 2021-03-27 DIAGNOSIS — M810 Age-related osteoporosis without current pathological fracture: Secondary | ICD-10-CM

## 2021-04-03 ENCOUNTER — Ambulatory Visit (INDEPENDENT_AMBULATORY_CARE_PROVIDER_SITE_OTHER): Payer: Medicare Other | Admitting: Physical Medicine and Rehabilitation

## 2021-04-03 ENCOUNTER — Encounter: Payer: Self-pay | Admitting: Physical Medicine and Rehabilitation

## 2021-04-03 ENCOUNTER — Ambulatory Visit: Payer: Self-pay

## 2021-04-03 ENCOUNTER — Other Ambulatory Visit: Payer: Self-pay

## 2021-04-03 VITALS — BP 108/73 | HR 73

## 2021-04-03 DIAGNOSIS — M5416 Radiculopathy, lumbar region: Secondary | ICD-10-CM

## 2021-04-03 MED ORDER — BETAMETHASONE SOD PHOS & ACET 6 (3-3) MG/ML IJ SUSP
12.0000 mg | Freq: Once | INTRAMUSCULAR | Status: AC
Start: 2021-04-03 — End: 2021-04-03
  Administered 2021-04-03: 11:00:00 12 mg

## 2021-04-03 NOTE — Progress Notes (Signed)
Pt state lower back pain. Pt state lifting makes the pain worse. Pt state she takes pain meds to help ease her pain. Pt has hx of inj on 07/27/20 pt state it helped.   Numeric Pain Rating Scale and Functional Assessment Average Pain 8   In the last MONTH (on 0-10 scale) has pain interfered with the following?  1. General activity like being  able to carry out your everyday physical activities such as walking, climbing stairs, carrying groceries, or moving a chair?  Rating(10)   +Driver, -BT, -Dye Allergies.

## 2021-04-03 NOTE — Patient Instructions (Signed)

## 2021-04-04 ENCOUNTER — Ambulatory Visit
Admission: RE | Admit: 2021-04-04 | Discharge: 2021-04-04 | Disposition: A | Discharge status: Home / Self Care | Payer: Medicare Other | Source: Ambulatory Visit | Attending: Family Medicine | Admitting: Family Medicine

## 2021-04-04 DIAGNOSIS — Z1231 Encounter for screening mammogram for malignant neoplasm of breast: Secondary | ICD-10-CM

## 2021-04-16 NOTE — Procedures (Signed)
Lumbar Epidural Steroid Injection - Interlaminar Approach with Fluoroscopic Guidance  Patient: Tracy Frye      Date of Birth: May 09, 1956 MRN: [id number] PCP: Sigmund Hazel, MD      Visit Date: 04/03/2021   Universal Protocol:     Consent Given By: the patient  Position: PRONE  Additional Comments: Vital signs were monitored before and after the procedure. Patient was prepped and draped in the usual sterile fashion. The correct patient, procedure, and site was verified.   Injection Procedure Details:   Procedure diagnoses: Lumbar radiculopathy [M54.16]   Meds Administered:  Meds ordered this encounter  Medications   betamethasone acetate-betamethasone sodium phosphate (CELESTONE) injection 12 mg     Laterality: Right  Location/Site:  L4-5  Needle: 3.5 in., 20 ga. Tuohy  Needle Placement: Paramedian epidural  Findings:   -Comments: Excellent flow of contrast into the epidural space.  Procedure Details: Using a paramedian approach from the side mentioned above, the region overlying the inferior lamina was localized under fluoroscopic visualization and the soft tissues overlying this structure were infiltrated with 4 ml. of 1% Lidocaine without Epinephrine. The Tuohy needle was inserted into the epidural space using a paramedian approach.   The epidural space was localized using loss of resistance along with counter oblique bi-planar fluoroscopic views.  After negative aspirate for air, blood, and CSF, a 2 ml. volume of Isovue-250 was injected into the epidural space and the flow of contrast was observed. Radiographs were obtained for documentation purposes.    The injectate was administered into the level noted above.   Additional Comments:  The patient tolerated the procedure well Dressing: 2 x 2 sterile gauze and Band-Aid    Post-procedure details: Patient was observed during the procedure. Post-procedure instructions were reviewed.  Patient left the  clinic in stable condition.

## 2021-04-16 NOTE — Progress Notes (Signed)
Tracy Frye - 65 y.o. female MRN [id number]  Date of birth: 1955-12-08  Office Visit Note: Visit Date: 04/03/2021 PCP: Sigmund Hazel, MD Referred by: Sigmund Hazel, MD  Subjective: Chief Complaint  Patient presents with   Lower Back - Pain   HPI:  Tracy Frye is a 65 y.o. female who comes in today for planned repeat Right L4-5  Lumbar Interlaminar epidural steroid injection with fluoroscopic guidance.  The patient has failed conservative care including home exercise, medications, time and activity modification.  This injection will be diagnostic and hopefully therapeutic.  Please see requesting physician notes for further details and justification. Patient received more than 50% pain relief from prior injection.  Consider updating MRI if she gets less than great results.  MRI from 2018.  Referring: Dr. Glee Arvin  ROS Otherwise per HPI.  Assessment & Plan: Visit Diagnoses:    ICD-10-CM   1. Lumbar radiculopathy  M54.16 XR C-ARM NO REPORT    Epidural Steroid injection    betamethasone acetate-betamethasone sodium phosphate (CELESTONE) injection 12 mg      Plan: No additional findings.   Meds & Orders:  Meds ordered this encounter  Medications   betamethasone acetate-betamethasone sodium phosphate (CELESTONE) injection 12 mg    Orders Placed This Encounter  Procedures   XR C-ARM NO REPORT   Epidural Steroid injection    Follow-up: Return if symptoms worsen or fail to improve.   Procedures: No procedures performed  Lumbar Epidural Steroid Injection - Interlaminar Approach with Fluoroscopic Guidance  Patient: Tracy Frye      Date of Birth: [birth year] MRN: [id number] PCP: Sigmund Hazel, MD      Visit Date: 04/03/2021   Universal Protocol:     Consent Given By: the patient  Position: PRONE  Additional Comments: Vital signs were monitored before and after the procedure. Patient was prepped and draped in the usual sterile fashion. The correct  patient, procedure, and site was verified.   Injection Procedure Details:   Procedure diagnoses: Lumbar radiculopathy [M54.16]   Meds Administered:  Meds ordered this encounter  Medications   betamethasone acetate-betamethasone sodium phosphate (CELESTONE) injection 12 mg     Laterality: Right  Location/Site:  L4-5  Needle: 3.5 in., 20 ga. Tuohy  Needle Placement: Paramedian epidural  Findings:   -Comments: Excellent flow of contrast into the epidural space.  Procedure Details: Using a paramedian approach from the side mentioned above, the region overlying the inferior lamina was localized under fluoroscopic visualization and the soft tissues overlying this structure were infiltrated with 4 ml. of 1% Lidocaine without Epinephrine. The Tuohy needle was inserted into the epidural space using a paramedian approach.   The epidural space was localized using loss of resistance along with counter oblique bi-planar fluoroscopic views.  After negative aspirate for air, blood, and CSF, a 2 ml. volume of Isovue-250 was injected into the epidural space and the flow of contrast was observed. Radiographs were obtained for documentation purposes.    The injectate was administered into the level noted above.   Additional Comments:  The patient tolerated the procedure well Dressing: 2 x 2 sterile gauze and Band-Aid    Post-procedure details: Patient was observed during the procedure. Post-procedure instructions were reviewed.  Patient left the clinic in stable condition.   Clinical History: MRI LUMBAR SPINE WITHOUT CONTRAST   TECHNIQUE: Multiplanar, multisequence MR imaging of the lumbar spine was performed. No intravenous contrast was administered.   COMPARISON:  Lumbar radiographs 01/15/2017.  FINDINGS: Segmentation: Normal lumbar segmentation demonstrated on the comparison radiographs.   Alignment: Moderate dextroconvex lumbar scoliosis. Straightening of lumbar lordosis.  Mild retrolisthesis at L4-L5 and L5-S1.   Vertebrae: Chronic degenerative endplate marrow signal changes at L4-L5. Background bone marrow signal is within normal limits. No marrow edema or evidence of acute osseous abnormality. Intact visible sacrum and SI joints.   Conus medullaris: Extends to the L1 level and appears normal.   Paraspinal and other soft tissues: Negative.   Disc levels:   T10-T11:  Partially visible mild disc bulge.   T11-12: Mild circumferential disc bulge. Mild to moderate facet hypertrophy. Mild left and moderate to severe right T11 foraminal stenosis.   T12-L1:  Negative.   L1-L2: Mild circumferential disc bulge with broad-based posterior component. Mild facet hypertrophy. Mild bilateral L1 foraminal stenosis.   L2-L3: Mild circumferential disc bulge. Mild facet hypertrophy. No stenosis.   L3-L4: Mild facet and ligament flavum hypertrophy greater on the right. Borderline to mild left L3 foraminal stenosis.   L4-L5: Severe disc space loss. Circumferential disc osteophyte complex with broad-based posterior component. Mild to moderate facet and ligament flavum hypertrophy. Borderline to mild spinal stenosis. Mild to moderate right lateral recess and right foraminal stenosis. Mild left foraminal stenosis.   L5-S1: Disc space loss. Mostly far lateral disc osteophyte complex. No spinal or lateral recess stenosis. Borderline to mild L5 bilateral foraminal stenosis.   IMPRESSION: 1. Dextroconvex lumbar scoliosis with straightening of lordosis and mild retrolisthesis at L4-L5 and L5-S1. 2. Advanced chronic L4-L5 and L5-S1 disc degeneration with endplate spurring. Up to mild spinal stenosis at L4-L5 associated with up to moderate right neural foraminal and right lateral recess stenosis at the right L4 and L5 nerve levels, respectively. 3. Only mild neural foraminal stenosis at L5-S1. Intermittent mild lumbar foraminal stenosis elsewhere. 4. T11-T12 disc  and advanced posterior element degeneration with up to severe right T11 foraminal stenosis.     Electronically Signed   By: Odessa Fleming M.D.   On: 03/25/2017 08:24     Objective:  VS:  HT:    WT:   BMI:     BP:108/73  HR:73bpm  TEMP: ( )  RESP:  Physical Exam Vitals and nursing note reviewed.  Constitutional:      General: She is not in acute distress.    Appearance: Normal appearance. She is not ill-appearing.  HENT:     Head: Normocephalic and atraumatic.     Right Ear: External ear normal.     Left Ear: External ear normal.  Eyes:     Extraocular Movements: Extraocular movements intact.  Cardiovascular:     Rate and Rhythm: Normal rate.     Pulses: Normal pulses.  Pulmonary:     Effort: Pulmonary effort is normal. No respiratory distress.  Abdominal:     General: There is no distension.     Palpations: Abdomen is soft.  Musculoskeletal:        General: Tenderness present.     Cervical back: Neck supple.     Right lower leg: No edema.     Left lower leg: No edema.     Comments: Patient has good distal strength with no pain over the greater trochanters.  No clonus or focal weakness.  Skin:    Findings: No erythema, lesion or rash.  Neurological:     General: No focal deficit present.     Mental Status: She is alert and oriented to person, place, and time.  Sensory: No sensory deficit.     Motor: No weakness or abnormal muscle tone.     Coordination: Coordination normal.  Psychiatric:        Mood and Affect: Mood normal.        Behavior: Behavior normal.     Imaging: No results found.

## 2021-09-08 ENCOUNTER — Ambulatory Visit
Admission: RE | Admit: 2021-09-08 | Discharge: 2021-09-08 | Disposition: A | Discharge status: Home / Self Care | Payer: Medicare Other | Source: Ambulatory Visit | Attending: Family Medicine | Admitting: Family Medicine

## 2021-09-08 DIAGNOSIS — M810 Age-related osteoporosis without current pathological fracture: Secondary | ICD-10-CM

## 2021-11-15 ENCOUNTER — Other Ambulatory Visit: Payer: Self-pay | Admitting: Physical Medicine and Rehabilitation

## 2021-11-15 ENCOUNTER — Telehealth: Payer: Self-pay | Admitting: Physical Medicine and Rehabilitation

## 2021-11-15 NOTE — Telephone Encounter (Signed)
Pt called requesting a call to set an appt. Please call pt at [phone].

## 2021-11-16 ENCOUNTER — Other Ambulatory Visit: Payer: Self-pay | Admitting: Physical Medicine and Rehabilitation

## 2021-11-16 MED ORDER — DIAZEPAM 5 MG PO TABS: ORAL_TABLET | ORAL | 0 refills | Status: DC

## 2021-11-23 ENCOUNTER — Telehealth: Payer: Self-pay | Admitting: Physical Medicine and Rehabilitation

## 2021-11-23 NOTE — Telephone Encounter (Signed)
T called requesting a different medication. Pt states the valium is too strong. Please call pt about this matter and send nem med to pharmacy on file. Pt phone number is [phone].

## 2021-11-24 ENCOUNTER — Other Ambulatory Visit: Payer: Self-pay | Admitting: Physical Medicine and Rehabilitation

## 2021-11-24 MED ORDER — METHOCARBAMOL 500 MG PO TABS: 500.0000 mg | ORAL_TABLET | Freq: Four times a day (QID) | ORAL | 1 refills | Status: DC | PRN

## 2021-12-11 ENCOUNTER — Ambulatory Visit: Payer: Self-pay

## 2021-12-11 ENCOUNTER — Encounter: Payer: Self-pay | Admitting: Physical Medicine and Rehabilitation

## 2021-12-11 ENCOUNTER — Ambulatory Visit (INDEPENDENT_AMBULATORY_CARE_PROVIDER_SITE_OTHER): Payer: Medicare Other | Admitting: Physical Medicine and Rehabilitation

## 2021-12-11 DIAGNOSIS — M5416 Radiculopathy, lumbar region: Secondary | ICD-10-CM

## 2021-12-11 MED ORDER — METHYLPREDNISOLONE ACETATE 80 MG/ML IJ SUSP
80.0000 mg | Freq: Once | INTRAMUSCULAR | Status: AC
  Administered 2021-12-11: 80 mg

## 2021-12-11 NOTE — Patient Instructions (Signed)

## 2021-12-11 NOTE — Progress Notes (Signed)
Pt state lower back pain. Pt state lifting makes the pain worse. Pt state she takes pain meds to help ease her pain.  Numeric Pain Rating Scale and Functional Assessment Average Pain 6   In the last MONTH (on 0-10 scale) has pain interfered with the following?  1. General activity like being  able to carry out your everyday physical activities such as walking, climbing stairs, carrying groceries, or moving a chair?  Rating(10)   +Driver, -BT, -Dye Allergies.

## 2021-12-18 NOTE — Progress Notes (Signed)
Tracy Frye - 66 y.o. female MRN [id number]  Date of birth: 10-14-1955  Office Visit Note: Visit Date: 12/11/2021 PCP: Sigmund Hazel, MD Referred by: Sigmund Hazel, MD  Subjective: Chief Complaint  Patient presents with   Lower Back - Pain   HPI:  Tracy Frye is a 66 y.o. female who comes in today for planned repeat Right L4-5  Lumbar Interlaminar epidural steroid injection with fluoroscopic guidance.  The patient has failed conservative care including home exercise, medications, time and activity modification.  This injection will be diagnostic and hopefully therapeutic.  Please see requesting physician notes for further details and justification. Patient received more than 50% pain relief from prior injection.   Referring: Dr. Glee Arvin and Ellin Goodie, FNP   ROS Otherwise per HPI.  Assessment & Plan: Visit Diagnoses:    ICD-10-CM   1. Lumbar radiculopathy  M54.16 XR C-ARM NO REPORT    Epidural Steroid injection    methylPREDNISolone acetate (DEPO-MEDROL) injection 80 mg      Plan: No additional findings.   Meds & Orders:  Meds ordered this encounter  Medications   methylPREDNISolone acetate (DEPO-MEDROL) injection 80 mg    Orders Placed This Encounter  Procedures   XR C-ARM NO REPORT   Epidural Steroid injection    Follow-up: Return for visit to requesting provider as needed.   Procedures: No procedures performed  Lumbar Epidural Steroid Injection - Interlaminar Approach with Fluoroscopic Guidance  Patient: Tracy Frye      Date of Birth: 27-Oct-1955 MRN: [id number] PCP: Sigmund Hazel, MD      Visit Date: 12/11/2021   Universal Protocol:     Consent Given By: the patient  Position: PRONE  Additional Comments: Vital signs were monitored before and after the procedure. Patient was prepped and draped in the usual sterile fashion. The correct patient, procedure, and site was verified.   Injection Procedure Details:   Procedure  diagnoses: Lumbar radiculopathy [M54.16]   Meds Administered:  Meds ordered this encounter  Medications   methylPREDNISolone acetate (DEPO-MEDROL) injection 80 mg     Laterality: Right  Location/Site:  L4-5  Needle: 3.5 in., 20 ga. Tuohy  Needle Placement: Paramedian epidural  Findings:   -Comments: Excellent flow of contrast into the epidural space.  Procedure Details: Using a paramedian approach from the side mentioned above, the region overlying the inferior lamina was localized under fluoroscopic visualization and the soft tissues overlying this structure were infiltrated with 4 ml. of 1% Lidocaine without Epinephrine. The Tuohy needle was inserted into the epidural space using a paramedian approach.   The epidural space was localized using loss of resistance along with counter oblique bi-planar fluoroscopic views.  After negative aspirate for air, blood, and CSF, a 2 ml. volume of Isovue-250 was injected into the epidural space and the flow of contrast was observed. Radiographs were obtained for documentation purposes.    The injectate was administered into the level noted above.   Additional Comments:  The patient tolerated the procedure well Dressing: 2 x 2 sterile gauze and Band-Aid    Post-procedure details: Patient was observed during the procedure. Post-procedure instructions were reviewed.  Patient left the clinic in stable condition.   Clinical History: No specialty comments available.     Objective:  VS:  HT:    WT:   BMI:     BP:   HR: bpm  TEMP: ( )  RESP:  Physical Exam Vitals and nursing note reviewed.  Constitutional:  General: She is not in acute distress.    Appearance: Normal appearance. She is not ill-appearing.  HENT:     Head: Normocephalic and atraumatic.     Right Ear: External ear normal.     Left Ear: External ear normal.  Eyes:     Extraocular Movements: Extraocular movements intact.  Cardiovascular:     Rate and Rhythm:  Normal rate.     Pulses: Normal pulses.  Pulmonary:     Effort: Pulmonary effort is normal. No respiratory distress.  Abdominal:     General: There is no distension.     Palpations: Abdomen is soft.  Musculoskeletal:        General: Tenderness present.     Cervical back: Neck supple.     Right lower leg: No edema.     Left lower leg: No edema.     Comments: Patient has good distal strength with no pain over the greater trochanters.  No clonus or focal weakness.  Skin:    Findings: No erythema, lesion or rash.  Neurological:     General: No focal deficit present.     Mental Status: She is alert and oriented to person, place, and time.     Sensory: No sensory deficit.     Motor: No weakness or abnormal muscle tone.     Coordination: Coordination normal.  Psychiatric:        Mood and Affect: Mood normal.        Behavior: Behavior normal.      Imaging: No results found.

## 2021-12-18 NOTE — Procedures (Signed)
Lumbar Epidural Steroid Injection - Interlaminar Approach with Fluoroscopic Guidance  Patient: Tracy Frye      Date of Birth: Oct 23, 1955 MRN: [id number] PCP: Sigmund Hazel, MD      Visit Date: 12/11/2021   Universal Protocol:     Consent Given By: the patient  Position: PRONE  Additional Comments: Vital signs were monitored before and after the procedure. Patient was prepped and draped in the usual sterile fashion. The correct patient, procedure, and site was verified.   Injection Procedure Details:   Procedure diagnoses: Lumbar radiculopathy [M54.16]   Meds Administered:  Meds ordered this encounter  Medications   methylPREDNISolone acetate (DEPO-MEDROL) injection 80 mg     Laterality: Right  Location/Site:  L4-5  Needle: 3.5 in., 20 ga. Tuohy  Needle Placement: Paramedian epidural  Findings:   -Comments: Excellent flow of contrast into the epidural space.  Procedure Details: Using a paramedian approach from the side mentioned above, the region overlying the inferior lamina was localized under fluoroscopic visualization and the soft tissues overlying this structure were infiltrated with 4 ml. of 1% Lidocaine without Epinephrine. The Tuohy needle was inserted into the epidural space using a paramedian approach.   The epidural space was localized using loss of resistance along with counter oblique bi-planar fluoroscopic views.  After negative aspirate for air, blood, and CSF, a 2 ml. volume of Isovue-250 was injected into the epidural space and the flow of contrast was observed. Radiographs were obtained for documentation purposes.    The injectate was administered into the level noted above.   Additional Comments:  The patient tolerated the procedure well Dressing: 2 x 2 sterile gauze and Band-Aid    Post-procedure details: Patient was observed during the procedure. Post-procedure instructions were reviewed.  Patient left the clinic in stable  condition.

## 2022-03-14 ENCOUNTER — Other Ambulatory Visit: Payer: Self-pay | Admitting: Family Medicine

## 2022-03-14 DIAGNOSIS — Z1231 Encounter for screening mammogram for malignant neoplasm of breast: Secondary | ICD-10-CM

## 2022-05-09 ENCOUNTER — Ambulatory Visit: Payer: Medicare Other

## 2022-05-09 ENCOUNTER — Ambulatory Visit
Admission: RE | Admit: 2022-05-09 | Discharge: 2022-05-09 | Disposition: A | Discharge status: Home / Self Care | Payer: Medicare Other | Source: Ambulatory Visit | Attending: Family Medicine | Admitting: Family Medicine

## 2022-05-09 DIAGNOSIS — Z1231 Encounter for screening mammogram for malignant neoplasm of breast: Secondary | ICD-10-CM

## 2022-05-21 HISTORY — PX: EYE SURGERY: SHX253

## 2022-07-19 ENCOUNTER — Telehealth: Payer: Self-pay | Admitting: Physical Medicine and Rehabilitation

## 2022-07-19 NOTE — Telephone Encounter (Signed)
Patient called. She would like an appointment with Dr. Ernestina Patches.

## 2022-07-20 ENCOUNTER — Other Ambulatory Visit: Payer: Self-pay | Admitting: Physical Medicine and Rehabilitation

## 2022-07-20 DIAGNOSIS — M5416 Radiculopathy, lumbar region: Secondary | ICD-10-CM

## 2022-07-20 NOTE — Telephone Encounter (Signed)
Spoke with patient and she is having the same pain as before. The last injection helped more than 70% and started to wear off recently. She has no new fall, accidents or injuries. Please advise

## 2022-08-01 ENCOUNTER — Other Ambulatory Visit: Payer: Self-pay | Admitting: Physical Medicine and Rehabilitation

## 2022-08-01 ENCOUNTER — Ambulatory Visit: Payer: Self-pay

## 2022-08-01 ENCOUNTER — Ambulatory Visit (INDEPENDENT_AMBULATORY_CARE_PROVIDER_SITE_OTHER): Payer: Medicare Other | Admitting: Physical Medicine and Rehabilitation

## 2022-08-01 VITALS — BP 112/75 | HR 61

## 2022-08-01 DIAGNOSIS — M5416 Radiculopathy, lumbar region: Secondary | ICD-10-CM

## 2022-08-01 MED ORDER — METHYLPREDNISOLONE ACETATE 80 MG/ML IJ SUSP
80.0000 mg | Freq: Once | INTRAMUSCULAR | Status: AC
  Administered 2022-08-01: 80 mg

## 2022-08-01 MED ORDER — METHOCARBAMOL 500 MG PO TABS
500.0000 mg | ORAL_TABLET | Freq: Four times a day (QID) | ORAL | 1 refills | Status: DC | PRN
Start: 2022-08-01 — End: 2023-08-19

## 2022-08-01 NOTE — Patient Instructions (Signed)

## 2022-08-01 NOTE — Progress Notes (Signed)
Functional Pain Scale - descriptive words and definitions  Unmanageable (7)  Pain interferes with normal ADL's/nothing seems to help/sleep is very difficult/active distractions are very difficult to concentrate on. Severe range order  Average Pain 8   +Driver, -BT, -Dye Allergies.  Lower back pain on right side that radiates into right leg

## 2022-08-08 NOTE — Procedures (Signed)
Lumbar Epidural Steroid Injection - Interlaminar Approach with Fluoroscopic Guidance  Patient: Tracy Frye      Date of Birth: [birth year] MRN: [id number] PCP: Kathyrn Lass, MD      Visit Date: 08/01/2022   Universal Protocol:     Consent Given By: the patient  Position: PRONE  Additional Comments: Vital signs were monitored before and after the procedure. Patient was prepped and draped in the usual sterile fashion. The correct patient, procedure, and site was verified.   Injection Procedure Details:   Procedure diagnoses: Lumbar radiculopathy [M54.16]   Meds Administered:  Meds ordered this encounter  Medications   methylPREDNISolone acetate (DEPO-MEDROL) injection 80 mg     Laterality: Right  Location/Site:  L4-5  Needle: 4.5 in., 20 ga. Tuohy  Needle Placement: Paramedian epidural  Findings:   -Comments: Excellent flow of contrast into the epidural space.  Procedure Details: Using a paramedian approach from the side mentioned above, the region overlying the inferior lamina was localized under fluoroscopic visualization and the soft tissues overlying this structure were infiltrated with 4 ml. of 1% Lidocaine without Epinephrine. The Tuohy needle was inserted into the epidural space using a paramedian approach.   The epidural space was localized using loss of resistance along with counter oblique bi-planar fluoroscopic views.  After negative aspirate for air, blood, and CSF, a 2 ml. volume of Isovue-250 was injected into the epidural space and the flow of contrast was observed. Radiographs were obtained for documentation purposes.    The injectate was administered into the level noted above.   Additional Comments:  No complications occurred Dressing: 2 x 2 sterile gauze and Band-Aid    Post-procedure details: Patient was observed during the procedure. Post-procedure instructions were reviewed.  Patient left the clinic in stable condition.

## 2022-08-08 NOTE — Progress Notes (Signed)
Tracy Frye - 67 y.o. female MRN [id number]  Date of birth: [birth year]  Office Visit Note: Visit Date: 08/01/2022 PCP: Kathyrn Lass, MD Referred by: Kathyrn Lass, MD  Subjective: Chief Complaint  Patient presents with   Lower Back - Pain   HPI:  Tracy Frye is a 67 y.o. female who comes in today for planned repeat Right L4-5  Lumbar Interlaminar epidural steroid injection with fluoroscopic guidance.  The patient has failed conservative care including home exercise, medications, time and activity modification.  This injection will be diagnostic and hopefully therapeutic.  Please see requesting physician notes for further details and justification. Patient received more than 50% pain relief from prior injection.   Referring: Dr. Eduard Roux   ROS Otherwise per HPI.  Assessment & Plan: Visit Diagnoses:    ICD-10-CM   1. Lumbar radiculopathy  M54.16 XR C-ARM NO REPORT    Epidural Steroid injection    methylPREDNISolone acetate (DEPO-MEDROL) injection 80 mg      Plan: No additional findings.   Meds & Orders:  Meds ordered this encounter  Medications   methylPREDNISolone acetate (DEPO-MEDROL) injection 80 mg    Orders Placed This Encounter  Procedures   XR C-ARM NO REPORT   Epidural Steroid injection    Follow-up: Return for visit to requesting provider as needed.   Procedures: No procedures performed  Lumbar Epidural Steroid Injection - Interlaminar Approach with Fluoroscopic Guidance  Patient: Tracy Frye      Date of Birth: 1956/03/31 MRN: [id number] PCP: Kathyrn Lass, MD      Visit Date: 08/01/2022   Universal Protocol:     Consent Given By: the patient  Position: PRONE  Additional Comments: Vital signs were monitored before and after the procedure. Patient was prepped and draped in the usual sterile fashion. The correct patient, procedure, and site was verified.   Injection Procedure Details:   Procedure diagnoses: Lumbar  radiculopathy [M54.16]   Meds Administered:  Meds ordered this encounter  Medications   methylPREDNISolone acetate (DEPO-MEDROL) injection 80 mg     Laterality: Right  Location/Site:  L4-5  Needle: 4.5 in., 20 ga. Tuohy  Needle Placement: Paramedian epidural  Findings:   -Comments: Excellent flow of contrast into the epidural space.  Procedure Details: Using a paramedian approach from the side mentioned above, the region overlying the inferior lamina was localized under fluoroscopic visualization and the soft tissues overlying this structure were infiltrated with 4 ml. of 1% Lidocaine without Epinephrine. The Tuohy needle was inserted into the epidural space using a paramedian approach.   The epidural space was localized using loss of resistance along with counter oblique bi-planar fluoroscopic views.  After negative aspirate for air, blood, and CSF, a 2 ml. volume of Isovue-250 was injected into the epidural space and the flow of contrast was observed. Radiographs were obtained for documentation purposes.    The injectate was administered into the level noted above.   Additional Comments:  No complications occurred Dressing: 2 x 2 sterile gauze and Band-Aid    Post-procedure details: Patient was observed during the procedure. Post-procedure instructions were reviewed.  Patient left the clinic in stable condition.   Clinical History: No specialty comments available.     Objective:  VS:  HT:    WT:   BMI:     BP:112/75  HR:61bpm  TEMP: ( )  RESP:  Physical Exam Vitals and nursing note reviewed.  Constitutional:      General: She is not in  acute distress.    Appearance: Normal appearance. She is not ill-appearing.  HENT:     Head: Normocephalic and atraumatic.     Right Ear: External ear normal.     Left Ear: External ear normal.  Eyes:     Extraocular Movements: Extraocular movements intact.  Cardiovascular:     Rate and Rhythm: Normal rate.     Pulses:  Normal pulses.  Pulmonary:     Effort: Pulmonary effort is normal. No respiratory distress.  Abdominal:     General: There is no distension.     Palpations: Abdomen is soft.  Musculoskeletal:        General: Tenderness present.     Cervical back: Neck supple.     Right lower leg: No edema.     Left lower leg: No edema.     Comments: Patient has good distal strength with no pain over the greater trochanters.  No clonus or focal weakness.  Skin:    Findings: No erythema, lesion or rash.  Neurological:     General: No focal deficit present.     Mental Status: She is alert and oriented to person, place, and time.     Sensory: No sensory deficit.     Motor: No weakness or abnormal muscle tone.     Coordination: Coordination normal.  Psychiatric:        Mood and Affect: Mood normal.        Behavior: Behavior normal.      Imaging: No results found.

## 2022-09-12 ENCOUNTER — Encounter: Payer: Self-pay | Admitting: Podiatry

## 2022-09-12 ENCOUNTER — Ambulatory Visit (INDEPENDENT_AMBULATORY_CARE_PROVIDER_SITE_OTHER): Payer: Medicare Other

## 2022-09-12 ENCOUNTER — Ambulatory Visit (INDEPENDENT_AMBULATORY_CARE_PROVIDER_SITE_OTHER): Payer: Medicare Other | Admitting: Podiatry

## 2022-09-12 DIAGNOSIS — R252 Cramp and spasm: Secondary | ICD-10-CM

## 2022-09-12 DIAGNOSIS — M19072 Primary osteoarthritis, left ankle and foot: Secondary | ICD-10-CM | POA: Diagnosis not present

## 2022-09-12 DIAGNOSIS — Z9889 Other specified postprocedural states: Secondary | ICD-10-CM

## 2022-09-12 MED ORDER — CYCLOBENZAPRINE HCL 10 MG PO TABS: 10.0000 mg | ORAL_TABLET | Freq: Three times a day (TID) | ORAL | 0 refills | Status: DC | PRN

## 2022-09-12 NOTE — Progress Notes (Signed)
  Subjective:  Patient ID: Tracy Frye, female    DOB: [birth year],   MRN: [id number]  Chief Complaint  Patient presents with   Foot Problem    Patient states that her left foot freezes up and cramps depending on the shoes being worn and when the toes freeze up patient states she can't move foot and it becomes painful     67 y.o. female presents for concern as above. Has had previous subtalar arthrodesis in 2019.  Relates she has had cramps before and mustard usually helps but not helping anymore.  . Denies any other pedal complaints. Denies n/v/f/c.   History reviewed. No pertinent past medical history.  Objective:  Physical Exam: Vascular: DP/PT pulses 2/4 bilateral. CFT <3 seconds. Normal hair growth on digits. No edema.  Skin. No lacerations or abrasions bilateral feet.  Musculoskeletal: MMT 5/5 bilateral lower extremities in DF, PF, Inversion and Eversion. Deceased ROM in DF of ankle joint. No tenderness to palpation about the foot currenlty. No Rom of the STJ. No pain around the subtalsar joint Neurological: Sensation intact to light touch.   Assessment:   1. Foot cramps   2. Osteoarthritis of left subtalar joint      Plan:  Patient was evaluated and treated and all questions answered. Discussed midfoot arthritis with patient and treatment options.  X-rays reviewed. No acute fractures or dislocations. Retained hardware from previous subtalar fusion. Appears fused in some areas but middle facet minimal osseous bridging noted.  Discussed NSAIDS, and shoe gear.  Discussed stiff soled shoes and arch supports  Flexeril provided to see if this will help with what sounds like muscle spasms.   Patient to follow-up as needed.    Louann Sjogren, DPM

## 2022-11-15 ENCOUNTER — Telehealth: Payer: Self-pay | Admitting: Physical Medicine and Rehabilitation

## 2022-11-15 NOTE — Telephone Encounter (Signed)
Pt called requesting a back injection. Please call pt at [phone].

## 2022-11-19 ENCOUNTER — Telehealth: Payer: Self-pay | Admitting: Physical Medicine and Rehabilitation

## 2022-11-19 ENCOUNTER — Other Ambulatory Visit: Payer: Self-pay | Admitting: Physical Medicine and Rehabilitation

## 2022-11-19 DIAGNOSIS — M5416 Radiculopathy, lumbar region: Secondary | ICD-10-CM

## 2022-11-19 NOTE — Telephone Encounter (Signed)
See previous encounter

## 2022-11-19 NOTE — Telephone Encounter (Signed)
Spoke with patient and she is requesting a back injection. Last injection on 08/01/22 lasted until 11/15/22 and she had 75% or greater relief. No new falls, accidents or injuries.  Also, patient went to [facility] urgent care and received prednisone. They informed her to ask [place] if it is okay to take it. Please advise

## 2022-11-19 NOTE — Progress Notes (Signed)
Patient reports greater than 75% relief of pain for 3 or more months with right L4-L5 interlaminar epidural steroid injection performed in our office on 08/01/2022. I placed order for repeat injection.

## 2022-11-19 NOTE — Telephone Encounter (Signed)
Pt called went to emergency room for back pains. Pt states she need a call as soon as possible. Please call pt at [phone].

## 2022-12-04 ENCOUNTER — Encounter: Payer: Self-pay | Admitting: Physical Medicine and Rehabilitation

## 2022-12-04 ENCOUNTER — Other Ambulatory Visit: Payer: Self-pay

## 2022-12-04 ENCOUNTER — Ambulatory Visit (INDEPENDENT_AMBULATORY_CARE_PROVIDER_SITE_OTHER): Payer: Medicare Other | Admitting: Physical Medicine and Rehabilitation

## 2022-12-04 VITALS — BP 115/78 | HR 80

## 2022-12-04 DIAGNOSIS — M5416 Radiculopathy, lumbar region: Secondary | ICD-10-CM | POA: Diagnosis not present

## 2022-12-04 MED ORDER — METHYLPREDNISOLONE ACETATE 80 MG/ML IJ SUSP
80.0000 mg | Freq: Once | INTRAMUSCULAR | Status: AC
  Administered 2022-12-04: 80 mg

## 2022-12-04 NOTE — Patient Instructions (Signed)

## 2022-12-04 NOTE — Progress Notes (Signed)
Functional Pain Scale - descriptive words and definitions  Distracting (5)    Aware of pain/able to complete some ADL's but limited by pain/sleep is affected and active distractions are only slightly useful. Moderate range order  Average Pain 6  Low back pain that radiates in to the right leg.  Hurts when lifting.  Gets worse at night.  No numbness and tingling.   Takes methocarbamol       +Driver, -BT, -Dye Allergies.

## 2022-12-18 NOTE — Procedures (Signed)
Lumbar Epidural Steroid Injection - Interlaminar Approach with Fluoroscopic Guidance  Patient: Tracy Frye      Date of Birth: [birth year] MRN: [id number] PCP: Sigmund Hazel, MD      Visit Date: 12/04/2022   Universal Protocol:     Consent Given By: the patient  Position: PRONE  Additional Comments: Vital signs were monitored before and after the procedure. Patient was prepped and draped in the usual sterile fashion. The correct patient, procedure, and site was verified.   Injection Procedure Details:   Procedure diagnoses: Lumbar radiculopathy [M54.16]   Meds Administered:  Meds ordered this encounter  Medications   methylPREDNISolone acetate (DEPO-MEDROL) injection 80 mg     Laterality: Right  Location/Site:  L4-5  Needle: 3.5 in., 20 ga. Tuohy  Needle Placement: Paramedian epidural  Findings:   -Comments: Excellent flow of contrast into the epidural space.  Procedure Details: Using a paramedian approach from the side mentioned above, the region overlying the inferior lamina was localized under fluoroscopic visualization and the soft tissues overlying this structure were infiltrated with 4 ml. of 1% Lidocaine without Epinephrine. The Tuohy needle was inserted into the epidural space using a paramedian approach.   The epidural space was localized using loss of resistance along with counter oblique bi-planar fluoroscopic views.  After negative aspirate for air, blood, and CSF, a 2 ml. volume of Isovue-250 was injected into the epidural space and the flow of contrast was observed. Radiographs were obtained for documentation purposes.    The injectate was administered into the level noted above.   Additional Comments:  No complications occurred Dressing: 2 x 2 sterile gauze and Band-Aid    Post-procedure details: Patient was observed during the procedure. Post-procedure instructions were reviewed.  Patient left the clinic in stable condition.

## 2022-12-18 NOTE — Progress Notes (Signed)
Tracy Frye - 67 y.o. female MRN [id number]  Date of birth: 05/14/1956  Office Visit Note: Visit Date: 12/04/2022 PCP: Sigmund Hazel, MD Referred by: Sigmund Hazel, MD  Subjective: Chief Complaint  Patient presents with   Lower Back - Pain   HPI:  Tracy Frye is a 67 y.o. female who comes in today at the request of Tracy Goodie, FNP for planned Right L4-5 Lumbar Interlaminar epidural steroid injection with fluoroscopic guidance.  The patient has failed conservative care including home exercise, medications, time and activity modification.  This injection will be diagnostic and hopefully therapeutic.  Please see requesting physician notes for further details and justification.   ROS Otherwise per HPI.  Assessment & Plan: Visit Diagnoses:    ICD-10-CM   1. Lumbar radiculopathy  M54.16 XR C-ARM NO REPORT    Epidural Steroid injection    methylPREDNISolone acetate (DEPO-MEDROL) injection 80 mg      Plan: No additional findings.   Meds & Orders:  Meds ordered this encounter  Medications   methylPREDNISolone acetate (DEPO-MEDROL) injection 80 mg    Orders Placed This Encounter  Procedures   XR C-ARM NO REPORT   Epidural Steroid injection    Follow-up: Return for visit to requesting provider as needed.   Procedures: No procedures performed  Lumbar Epidural Steroid Injection - Interlaminar Approach with Fluoroscopic Guidance  Patient: Tracy Frye      Date of Birth: [birth year] MRN: [id number] PCP: Sigmund Hazel, MD      Visit Date: 12/04/2022   Universal Protocol:     Consent Given By: the patient  Position: PRONE  Additional Comments: Vital signs were monitored before and after the procedure. Patient was prepped and draped in the usual sterile fashion. The correct patient, procedure, and site was verified.   Injection Procedure Details:   Procedure diagnoses: Lumbar radiculopathy [M54.16]   Meds Administered:  Meds ordered this  encounter  Medications   methylPREDNISolone acetate (DEPO-MEDROL) injection 80 mg     Laterality: Right  Location/Site:  L4-5  Needle: 3.5 in., 20 ga. Tuohy  Needle Placement: Paramedian epidural  Findings:   -Comments: Excellent flow of contrast into the epidural space.  Procedure Details: Using a paramedian approach from the side mentioned above, the region overlying the inferior lamina was localized under fluoroscopic visualization and the soft tissues overlying this structure were infiltrated with 4 ml. of 1% Lidocaine without Epinephrine. The Tuohy needle was inserted into the epidural space using a paramedian approach.   The epidural space was localized using loss of resistance along with counter oblique bi-planar fluoroscopic views.  After negative aspirate for air, blood, and CSF, a 2 ml. volume of Isovue-250 was injected into the epidural space and the flow of contrast was observed. Radiographs were obtained for documentation purposes.    The injectate was administered into the level noted above.   Additional Comments:  No complications occurred Dressing: 2 x 2 sterile gauze and Band-Aid    Post-procedure details: Patient was observed during the procedure. Post-procedure instructions were reviewed.  Patient left the clinic in stable condition.   Clinical History: MRI LUMBAR SPINE WITHOUT CONTRAST    TECHNIQUE:  Multiplanar, multisequence MR imaging of the lumbar spine was  performed. No intravenous contrast was administered.    COMPARISON:  Lumbar radiographs 01/15/2017.    FINDINGS:  Segmentation: Normal lumbar segmentation demonstrated on the  comparison radiographs.    Alignment: Moderate dextroconvex lumbar scoliosis. Straightening of  lumbar lordosis. Mild retrolisthesis  at L4-L5 and L5-S1.    Vertebrae: Chronic degenerative endplate marrow signal changes at  L4-L5. Background bone marrow signal is within normal limits. No  marrow edema or evidence  of acute osseous abnormality. Intact  visible sacrum and SI joints.    Conus medullaris: Extends to the L1 level and appears normal.    Paraspinal and other soft tissues: Negative.    Disc levels:    T10-T11:  Partially visible mild disc bulge.    T11-12: Mild circumferential disc bulge. Mild to moderate facet  hypertrophy. Mild left and moderate to severe right T11 foraminal  stenosis.    T12-L1: Negative.    L1-L2: Mild circumferential disc bulge with broad-based posterior  component. Mild facet hypertrophy. Mild bilateral L1 foraminal  stenosis.    L2-L3: Mild circumferential disc bulge. Mild facet hypertrophy. No  stenosis.    L3-L4: Mild facet and ligament flavum hypertrophy greater on the  right. Borderline to mild left L3 foraminal stenosis.    L4-L5: Severe disc space loss. Circumferential disc osteophyte  complex with broad-based posterior component. Mild to moderate facet  and ligament flavum hypertrophy. Borderline to mild spinal stenosis.  Mild to moderate right lateral recess and right foraminal stenosis.  Mild left foraminal stenosis.    L5-S1: Disc space loss. Mostly far lateral disc osteophyte complex.  No spinal or lateral recess stenosis. Borderline to mild L5  bilateral foraminal stenosis.    IMPRESSION:  1. Dextroconvex lumbar scoliosis with straightening of lordosis and  mild retrolisthesis at L4-L5 and L5-S1.  2. 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 Up to mild spinal stenosis at L4-L5 associated with up to  moderate right neural foraminal and right lateral recess stenosis at  the right L4 and L5 nerve levels, respectively.  3. Only mild neural foraminal stenosis at L5-S1. Intermittent mild  lumbar foraminal stenosis elsewhere.  4. T11-T12 disc and advanced posterior element degeneration with up  to severe right T11 foraminal stenosis.      Electronically Signed    By: Tracy Frye M.D.    On: 03/25/2017 08:24      Objective:  VS:  HT:    WT:   BMI:     BP:115/78  HR:80bpm  TEMP: ( )  RESP:  Physical Exam Vitals and nursing note reviewed.  Constitutional:      General: She is not in acute distress.    Appearance: Normal appearance. She is not ill-appearing.  HENT:     Head: Normocephalic and atraumatic.     Right Ear: External ear normal.     Left Ear: External ear normal.  Eyes:     Extraocular Movements: Extraocular movements intact.  Cardiovascular:     Rate and Rhythm: Normal rate.     Pulses: Normal pulses.  Pulmonary:     Effort: Pulmonary effort is normal. No respiratory distress.  Abdominal:     General: There is no distension.     Palpations: Abdomen is soft.  Musculoskeletal:        General: Tenderness present.     Cervical back: Neck supple.     Right lower leg: No edema.     Left lower leg: No edema.     Comments: Patient has good distal strength with no pain over the greater trochanters.  No clonus or focal weakness.  Skin:    Findings: No erythema, lesion or rash.  Neurological:     General: No focal deficit present.  Mental Status: She is alert and oriented to person, place, and time.     Sensory: No sensory deficit.     Motor: No weakness or abnormal muscle tone.     Coordination: Coordination normal.  Psychiatric:        Mood and Affect: Mood normal.        Behavior: Behavior normal.      Imaging: No results found.

## 2023-02-18 ENCOUNTER — Ambulatory Visit (INDEPENDENT_AMBULATORY_CARE_PROVIDER_SITE_OTHER): Payer: Self-pay

## 2023-02-18 ENCOUNTER — Ambulatory Visit (INDEPENDENT_AMBULATORY_CARE_PROVIDER_SITE_OTHER): Payer: Medicare Other | Admitting: Orthopaedic Surgery

## 2023-02-18 DIAGNOSIS — M25562 Pain in left knee: Secondary | ICD-10-CM | POA: Diagnosis not present

## 2023-02-18 MED ORDER — METHYLPREDNISOLONE ACETATE 40 MG/ML IJ SUSP
40.0000 mg | INTRAMUSCULAR | Status: AC | PRN
  Administered 2023-02-18: 40 mg via INTRA_ARTICULAR

## 2023-02-18 MED ORDER — LIDOCAINE HCL 1 % IJ SOLN
3.0000 mL | INTRAMUSCULAR | Status: AC | PRN
  Administered 2023-02-18: 3 mL

## 2023-02-18 NOTE — Progress Notes (Signed)
The patient is an active 67 year old female who actually appears much younger than her stated age.  She has been having left knee pain and swelling for about a month to a month and a half now with no known injury.  She does wear knee sleeve but is really not allowing her to do her daily walking.  She is never had an injection in that knee or surgery on that knee.  Examination of her left knee does show mild effusion.  The knee is aligned well but does have pain when I rotate the tibia on the femur at the medial compartment.  There is not a lot of patellofemoral crepitation at all and the knee moves well and does again feel ligamentously stable.  2 views of the left knee show well-maintained medial lateral compartments and patellofemoral joint with neutral alignment and no acute findings.  I was able to aspirate 25 cc of clear yellow fluid from her left knee and then placed a steroid injection in the knee.  Will see her back in a month to see how she is doing overall.  If she is asymptomatic, she can always cancel that appointment.  I have talked her about questioning the exercises and when she exercise walking on a flat surface.    Procedure Note  Patient: Tracy Frye             Date of Birth: 06/30/1955           MRN: [id number]             Visit Date: 02/18/2023  Procedures: Visit Diagnoses:  1. Acute pain of left knee     Large Joint Inj: L knee on 02/18/2023 2:06 PM Indications: diagnostic evaluation and pain Details: 22 G 1.5 in needle, superolateral approach  Arthrogram: No  Medications: 3 mL lidocaine 1 %; 40 mg methylPREDNISolone acetate 40 MG/ML Outcome: tolerated well, no immediate complications Procedure, treatment alternatives, risks and benefits explained, specific risks discussed. Consent was given by the patient. Immediately prior to procedure a time out was called to verify the correct patient, procedure, equipment, support staff and site/side marked as required.  Patient was prepped and draped in the usual sterile fashion.

## 2023-03-18 ENCOUNTER — Ambulatory Visit: Payer: Medicare Other | Admitting: Orthopaedic Surgery

## 2023-04-01 ENCOUNTER — Other Ambulatory Visit: Payer: Self-pay | Admitting: Family Medicine

## 2023-04-01 DIAGNOSIS — Z0000 Encounter for general adult medical examination without abnormal findings: Secondary | ICD-10-CM

## 2023-05-13 ENCOUNTER — Ambulatory Visit
Admission: RE | Admit: 2023-05-13 | Discharge: 2023-05-13 | Disposition: A | Discharge status: Home / Self Care | Payer: Medicare Other | Source: Ambulatory Visit | Attending: Family Medicine | Admitting: Family Medicine

## 2023-05-13 DIAGNOSIS — Z0000 Encounter for general adult medical examination without abnormal findings: Secondary | ICD-10-CM

## 2023-07-15 ENCOUNTER — Telehealth: Payer: Self-pay | Admitting: Physical Medicine and Rehabilitation

## 2023-07-15 ENCOUNTER — Other Ambulatory Visit: Payer: Self-pay | Admitting: Physical Medicine and Rehabilitation

## 2023-07-15 DIAGNOSIS — M5416 Radiculopathy, lumbar region: Secondary | ICD-10-CM

## 2023-07-15 NOTE — Telephone Encounter (Signed)
 Pt called requesting a call back to set repeat back injection. Last injection 7/24. Pt phone number is [phone].

## 2023-07-25 ENCOUNTER — Encounter: Admitting: Physical Medicine and Rehabilitation

## 2023-08-05 ENCOUNTER — Encounter: Admitting: Physical Medicine and Rehabilitation

## 2023-08-06 IMAGING — MG MM DIGITAL SCREENING BILAT W/ TOMO AND CAD
8 series · 8 of 24 positions shown · non-contrast
Comparison: Previous exam(s).

CLINICAL DATA: Screening.

EXAM:
DIGITAL SCREENING BILATERAL MAMMOGRAM WITH TOMOSYNTHESIS AND CAD
TECHNIQUE: Bilateral screening digital craniocaudal and mediolateral oblique
mammograms were obtained. Bilateral screening digital breast
tomosynthesis was performed. The images were evaluated with
computer-aided detection.

[R MLO synth-2D]
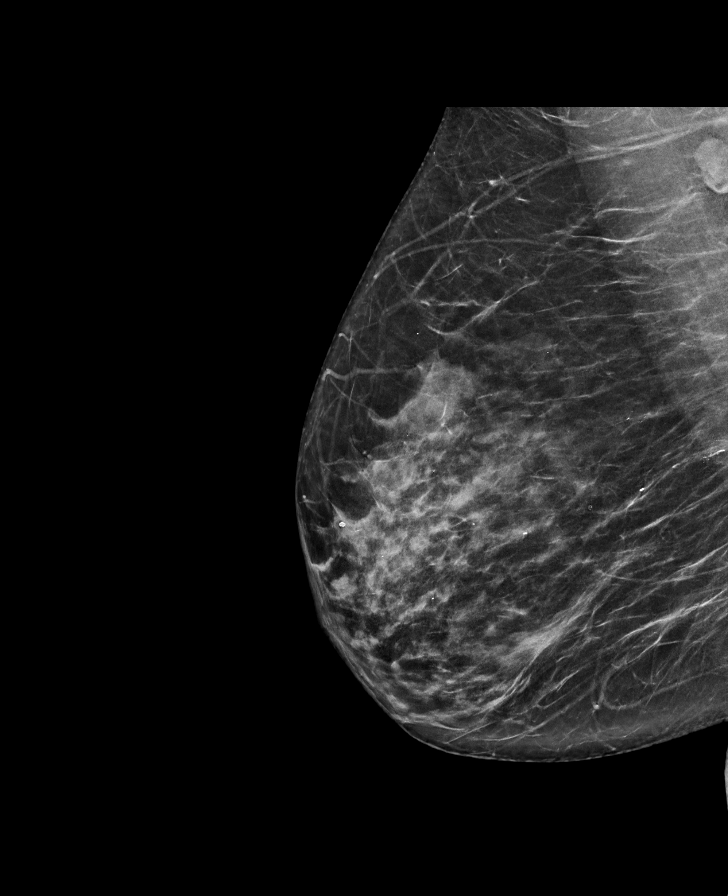

[L MLO synth-2D]
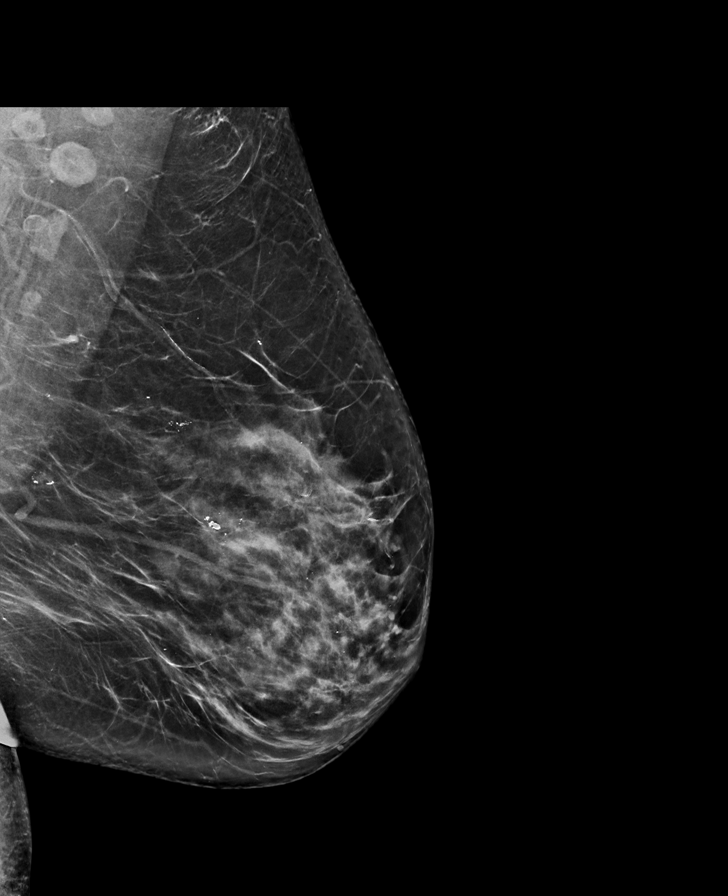

[R CC synth-2D]
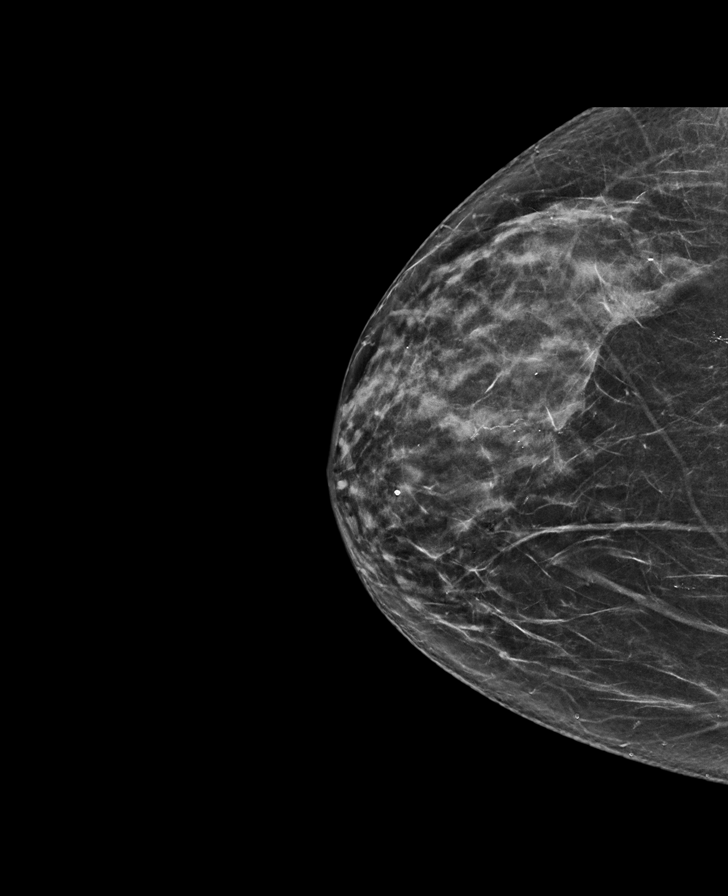

[L CC synth-2D]
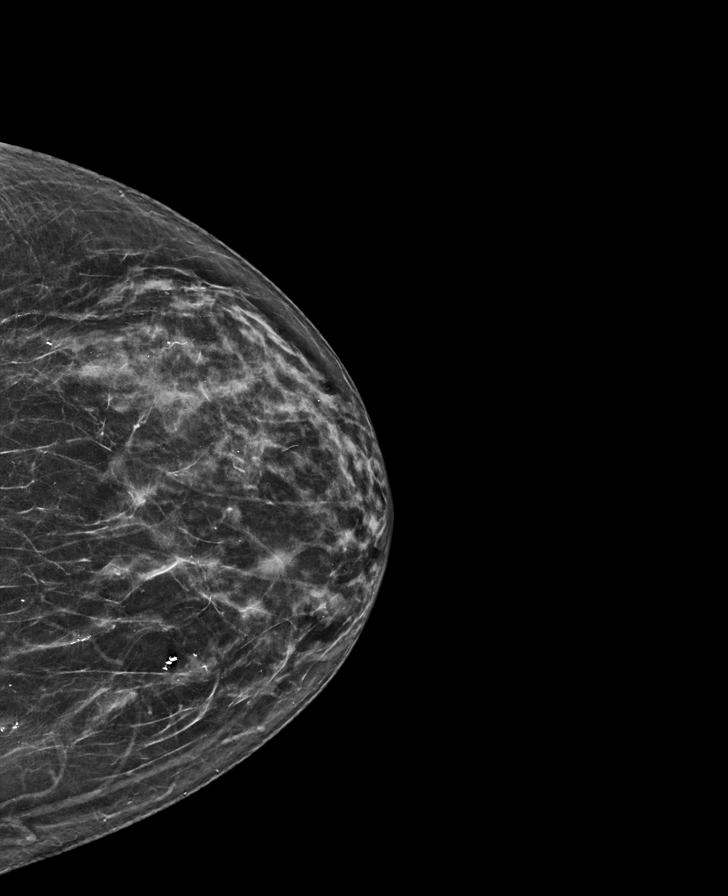

[L CC tomo · tomo slice 33/65.0]
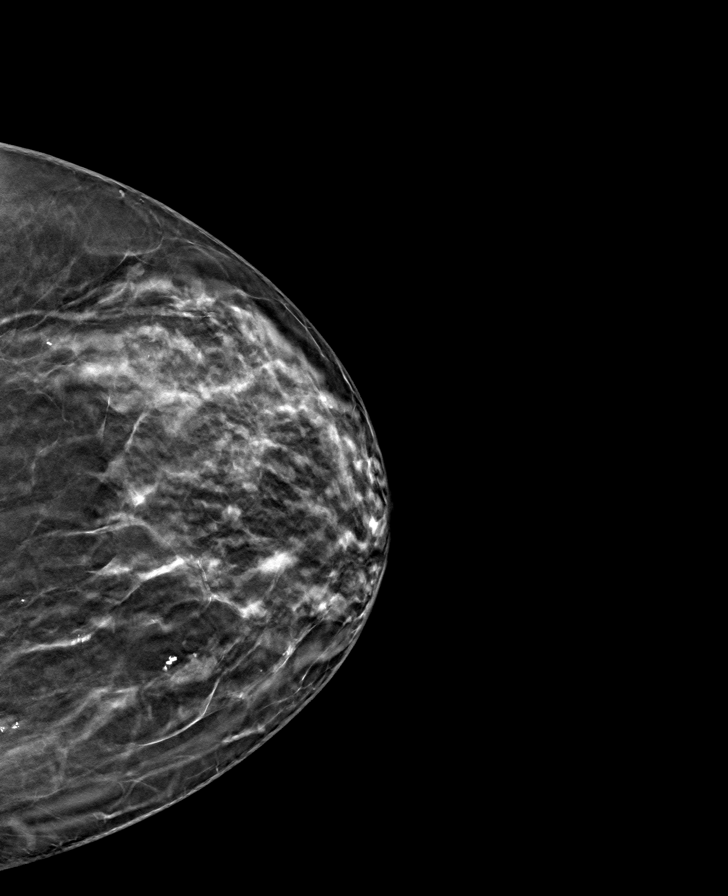

[R MLO tomo · tomo slice 38/75.0]
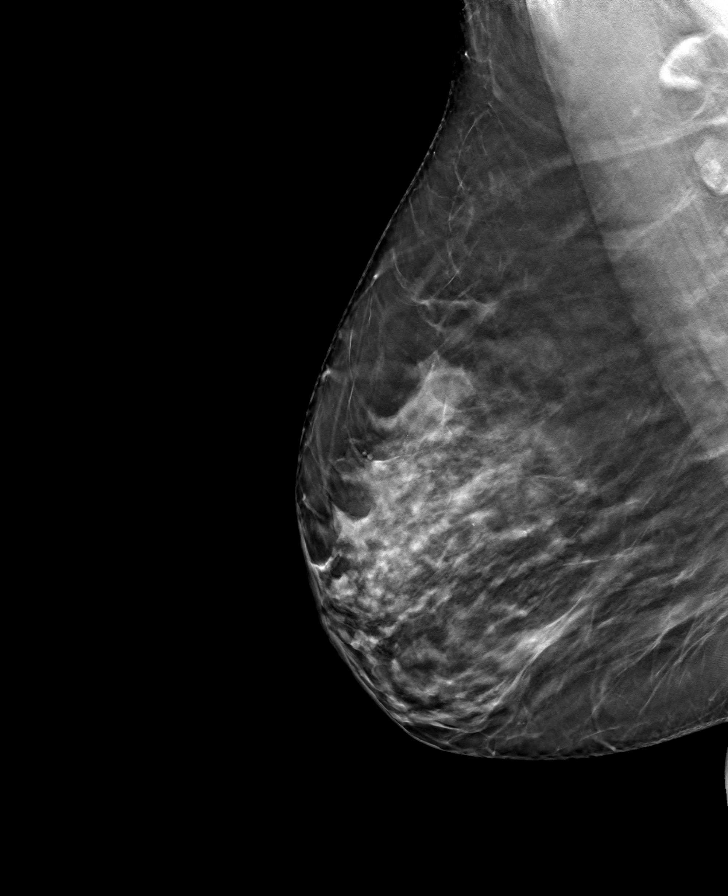

[R CC tomo · tomo slice 35/68.0]
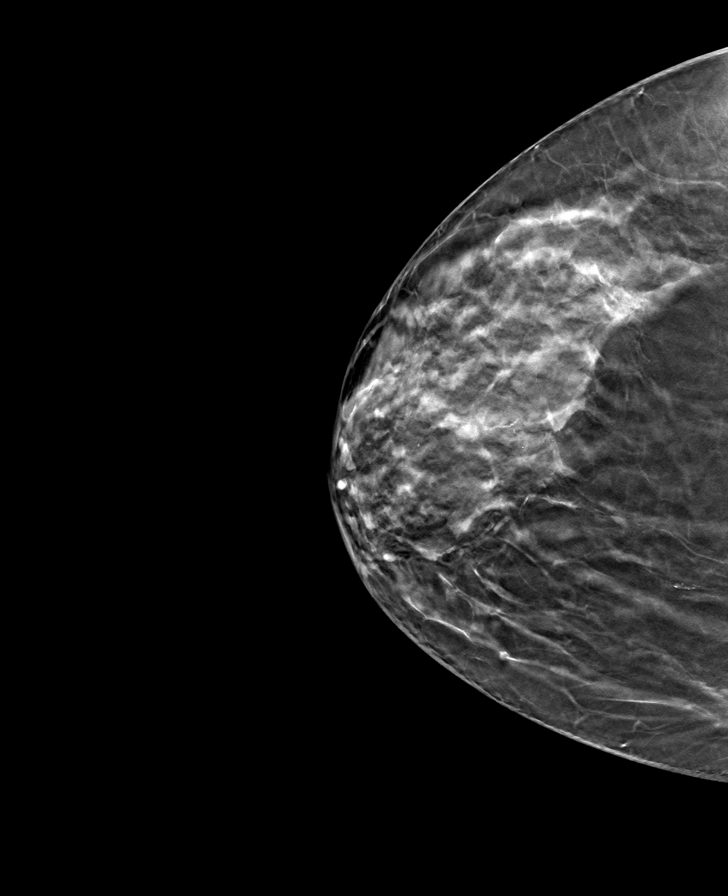

[L MLO tomo · tomo slice 41/82.0]
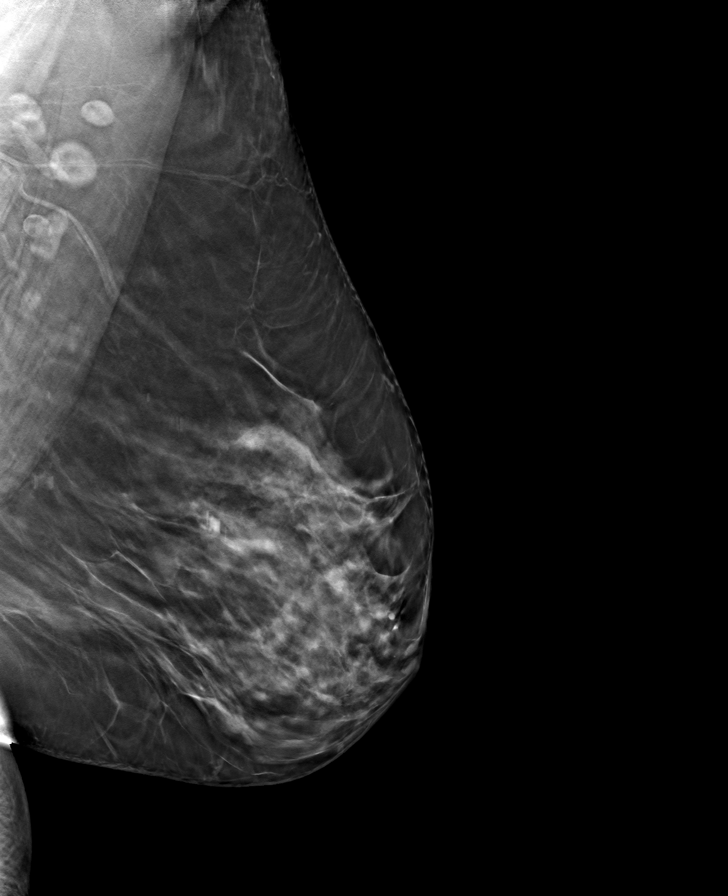

[8 of 24 positions shown; findings below may reference images not displayed]

ACR Breast Density Category c: The breast tissue is heterogeneously
dense, which may obscure small masses.
FINDINGS: There are no findings suspicious for malignancy.
IMPRESSION: No mammographic evidence of malignancy. A result letter of this
screening mammogram will be mailed directly to the patient.

RECOMMENDATION:
Screening mammogram in one year. (Code:Q3-W-BC3)

BI-RADS CATEGORY  1: Negative.

## 2023-08-19 ENCOUNTER — Encounter: Payer: Self-pay | Admitting: Physical Medicine and Rehabilitation

## 2023-08-19 ENCOUNTER — Other Ambulatory Visit: Payer: Self-pay

## 2023-08-19 ENCOUNTER — Ambulatory Visit (INDEPENDENT_AMBULATORY_CARE_PROVIDER_SITE_OTHER): Admitting: Physical Medicine and Rehabilitation

## 2023-08-19 VITALS — BP 114/76 | HR 69

## 2023-08-19 DIAGNOSIS — M25551 Pain in right hip: Secondary | ICD-10-CM

## 2023-08-19 DIAGNOSIS — M48061 Spinal stenosis, lumbar region without neurogenic claudication: Secondary | ICD-10-CM | POA: Diagnosis not present

## 2023-08-19 DIAGNOSIS — M5416 Radiculopathy, lumbar region: Secondary | ICD-10-CM | POA: Diagnosis not present

## 2023-08-19 DIAGNOSIS — M47816 Spondylosis without myelopathy or radiculopathy, lumbar region: Secondary | ICD-10-CM | POA: Diagnosis not present

## 2023-08-19 MED ORDER — METHYLPREDNISOLONE ACETATE 40 MG/ML IJ SUSP
40.0000 mg | Freq: Once | INTRAMUSCULAR | Status: AC
  Administered 2023-08-19: 40 mg

## 2023-08-19 MED ORDER — METHOCARBAMOL 500 MG PO TABS: 500.0000 mg | ORAL_TABLET | Freq: Three times a day (TID) | ORAL | 1 refills | Status: DC | PRN

## 2023-08-19 NOTE — Progress Notes (Signed)
 Pain Scale   Average Pain 6        +Driver, -BT, -Dye Allergies.

## 2023-08-19 NOTE — Patient Instructions (Signed)

## 2023-08-19 NOTE — Procedures (Signed)
 Lumbar Epidural Steroid Injection - Interlaminar Approach with Fluoroscopic Guidance  Patient: Tracy Frye      Date of Birth: 1955/11/25 MRN: [id number] PCP: Sigmund Hazel, MD      Visit Date: 08/19/2023   Universal Protocol:     Consent Given By: the patient  Position: PRONE  Additional Comments: Vital signs were monitored before and after the procedure. Patient was prepped and draped in the usual sterile fashion. The correct patient, procedure, and site was verified.   Injection Procedure Details:   Procedure diagnoses: Lumbar radiculopathy [M54.16]   Meds Administered:  Meds ordered this encounter  Medications   methylPREDNISolone acetate (DEPO-MEDROL) injection 40 mg     Laterality: Right  Location/Site:  L4-5  Needle: 3.5 in., 20 ga. Tuohy  Needle Placement: Paramedian epidural  Findings:   -Comments: Excellent flow of contrast into the epidural space.  Procedure Details: Using a paramedian approach from the side mentioned above, the region overlying the inferior lamina was localized under fluoroscopic visualization and the soft tissues overlying this structure were infiltrated with 4 ml. of 1% Lidocaine without Epinephrine. The Tuohy needle was inserted into the epidural space using a paramedian approach.   The epidural space was localized using loss of resistance along with counter oblique bi-planar fluoroscopic views.  After negative aspirate for air, blood, and CSF, a 2 ml. volume of Isovue-250 was injected into the epidural space and the flow of contrast was observed. Radiographs were obtained for documentation purposes.    The injectate was administered into the level noted above.   Additional Comments:  The patient tolerated the procedure well Dressing: 2 x 2 sterile gauze and Band-Aid    Post-procedure details: Patient was observed during the procedure. Post-procedure instructions were reviewed.  Patient left the clinic in stable  condition.

## 2023-08-19 NOTE — Addendum Note (Signed)
 Addended by: Ashok Norris on: 08/19/2023 03:14 PM   Modules accepted: Orders

## 2023-08-19 NOTE — Progress Notes (Addendum)
 Tracy Frye - 68 y.o. female MRN [id number]  Date of birth: February 25, 1956  Office Visit Note: Visit Date: 08/19/2023 PCP: Sigmund Hazel, MD Referred by: Sigmund Hazel, MD  Subjective: Chief Complaint  Patient presents with   Lower Back - Pain   HPI: Tracy Frye is a 68 y.o. female who comes in today For evaluation and management of continued chronic worsening and severe at times low back pain and right hip and thigh pain.  She has had this condition chronically with MRI evidence of facet arthropathy and degenerative disc changes and right lateral recess and foraminal narrowing at L4-5.  No high-grade stenosis.  MRI was last done in 2018 but she has had no red flag symptoms and was done well with intermittent injections over the years.  These injections have been interlaminar approach injections and she has done quite well.  The last injection she had was 6 months ago and did pretty well up until recently.  No specific new injuries or trauma etc.  No real changes overall of symptomatic clinical signs.  She does get symptoms of claudication little bit with standing and walking.  Some pain could be facet mediated.  She has had physical therapy in the past continues to stay active.  Had recent problems with cataract since had surgery and doing well with that.  She continues with muscle relaxers and does get spasming at times.  She is asking for refill of that today.  She denies any groin pain or pain with hip rotation.  No focal weakness.     Review of Systems  Musculoskeletal:  Positive for back pain and joint pain.  All other systems reviewed and are negative.  Otherwise per HPI.  Assessment & Plan: Visit Diagnoses:    ICD-10-CM   1. Lumbar radiculopathy  M54.16 XR C-ARM NO REPORT    Epidural Steroid injection    methylPREDNISolone acetate (DEPO-MEDROL) injection 40 mg    2. Spondylosis without myelopathy or radiculopathy, lumbar region  M47.816     3. Bilateral stenosis of  lateral recess of lumbar spine  M48.061     4. Pain in right hip  M25.551        Plan: Findings:  Chronic history of low back pain and radicular leg pain worse with standing ambulating consistent with lumbar lateral recess and foraminal narrowing and facet of her surgery.  She does have arthritic changes in the lumbar spine with degenerative disc height loss as well.  Some foraminal narrowing.  Older MRI at this point from 2018 but no real red flag complaints or new issues and has done well with intermittent injections.  We will repeat the injection today and depending on relief would look at updating lumbar spine MRI at some point soon.  Otherwise we will repeat fill her methocarbamol.    Meds & Orders:  Meds ordered this encounter  Medications   methylPREDNISolone acetate (DEPO-MEDROL) injection 40 mg   methocarbamol (ROBAXIN) 500 MG tablet    Sig: Take 1 tablet (500 mg total) by mouth every 8 (eight) hours as needed for muscle spasms.    Dispense:  30 tablet    Refill:  1    Orders Placed This Encounter  Procedures   XR C-ARM NO REPORT   Epidural Steroid injection    Follow-up: Return if symptoms worsen or fail to improve, for Consider updated Lspine MRI.   Procedures: No procedures performed  Lumbar Epidural Steroid Injection - Interlaminar Approach with Fluoroscopic Guidance  Patient: Tracy Frye      Date of Birth: 1955-06-07 MRN: [id number] PCP: Sigmund Hazel, MD      Visit Date: 08/19/2023   Universal Protocol:     Consent Given By: the patient  Position: PRONE  Additional Comments: Vital signs were monitored before and after the procedure. Patient was prepped and draped in the usual sterile fashion. The correct patient, procedure, and site was verified.   Injection Procedure Details:   Procedure diagnoses: Lumbar radiculopathy [M54.16]   Meds Administered:  Meds ordered this encounter  Medications   methylPREDNISolone acetate (DEPO-MEDROL) injection  40 mg     Laterality: Right  Location/Site:  L4-5  Needle: 3.5 in., 20 ga. Tuohy  Needle Placement: Paramedian epidural  Findings:   -Comments: Excellent flow of contrast into the epidural space.  Procedure Details: Using a paramedian approach from the side mentioned above, the region overlying the inferior lamina was localized under fluoroscopic visualization and the soft tissues overlying this structure were infiltrated with 4 ml. of 1% Lidocaine without Epinephrine. The Tuohy needle was inserted into the epidural space using a paramedian approach.   The epidural space was localized using loss of resistance along with counter oblique bi-planar fluoroscopic views.  After negative aspirate for air, blood, and CSF, a 2 ml. volume of Isovue-250 was injected into the epidural space and the flow of contrast was observed. Radiographs were obtained for documentation purposes.    The injectate was administered into the level noted above.   Additional Comments:  The patient tolerated the procedure well Dressing: 2 x 2 sterile gauze and Band-Aid    Post-procedure details: Patient was observed during the procedure. Post-procedure instructions were reviewed.  Patient left the clinic in stable condition.   Clinical History: MRI LUMBAR SPINE WITHOUT CONTRAST    TECHNIQUE:  Multiplanar, multisequence MR imaging of the lumbar spine was  performed. No intravenous contrast was administered.    COMPARISON:  Lumbar radiographs 01/15/2017.    FINDINGS:  Segmentation: Normal lumbar segmentation demonstrated on the  comparison radiographs.    Alignment: Moderate dextroconvex lumbar scoliosis. Straightening of  lumbar lordosis. Mild retrolisthesis at L4-L5 and L5-S1.    Vertebrae: Chronic degenerative endplate marrow signal changes at  L4-L5. Background bone marrow signal is within normal limits. No  marrow edema or evidence of acute osseous abnormality. Intact  visible sacrum and SI  joints.    Conus medullaris: Extends to the L1 level and appears normal.    Paraspinal and other soft tissues: Negative.    Disc levels:    T10-T11:  Partially visible mild disc bulge.    T11-12: Mild circumferential disc bulge. Mild to moderate facet  hypertrophy. Mild left and moderate to severe right T11 foraminal  stenosis.    T12-L1: Negative.    L1-L2: Mild circumferential disc bulge with broad-based posterior  component. Mild facet hypertrophy. Mild bilateral L1 foraminal  stenosis.    L2-L3: Mild circumferential disc bulge. Mild facet hypertrophy. No  stenosis.    L3-L4: Mild facet and ligament flavum hypertrophy greater on the  right. Borderline to mild left L3 foraminal stenosis.    L4-L5: Severe disc space loss. Circumferential disc osteophyte  complex with broad-based posterior component. Mild to moderate facet  and ligament flavum hypertrophy. Borderline to mild spinal stenosis.  Mild to moderate right lateral recess and right foraminal stenosis.  Mild left foraminal stenosis.    L5-S1: Disc space loss. Mostly far lateral disc osteophyte complex.  No spinal or lateral  recess stenosis. Borderline to mild L5  bilateral foraminal stenosis.    IMPRESSION:  1. Dextroconvex lumbar scoliosis with straightening of lordosis and  mild retrolisthesis at L4-L5 and L5-S1.  2. 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 Up to mild spinal stenosis at L4-L5 associated with up to  moderate right neural foraminal and right lateral recess stenosis at  the right L4 and L5 nerve levels, respectively.  3. Only mild neural foraminal stenosis at L5-S1. Intermittent mild  lumbar foraminal stenosis elsewhere.  4. T11-T12 disc and advanced posterior element degeneration with up  to severe right T11 foraminal stenosis.      Electronically Signed    By: Odessa Fleming M.D.    On: 03/25/2017 08:24   She reports that she has never smoked. She has never used  smokeless tobacco. No results for input(s): "HGBA1C", "LABURIC" in the last 8760 hours.  Objective:  VS:  HT:    WT:   BMI:     BP:114/76  HR:69bpm  TEMP: ( )  RESP:  Physical Exam Vitals and nursing note reviewed.  Constitutional:      General: She is not in acute distress.    Appearance: Normal appearance. She is not ill-appearing.  HENT:     Head: Normocephalic and atraumatic.     Right Ear: External ear normal.     Left Ear: External ear normal.  Eyes:     Extraocular Movements: Extraocular movements intact.  Cardiovascular:     Rate and Rhythm: Normal rate.     Pulses: Normal pulses.  Pulmonary:     Effort: Pulmonary effort is normal. No respiratory distress.  Abdominal:     General: There is no distension.     Palpations: Abdomen is soft.  Musculoskeletal:        General: Tenderness present.     Cervical back: Neck supple.     Right lower leg: No edema.     Left lower leg: No edema.     Comments: Patient has good distal strength with no pain over the greater trochanters.  No clonus or focal weakness.  Skin:    Findings: No erythema, lesion or rash.  Neurological:     General: No focal deficit present.     Mental Status: She is alert and oriented to person, place, and time.     Sensory: No sensory deficit.     Motor: No weakness or abnormal muscle tone.     Coordination: Coordination normal.  Psychiatric:        Mood and Affect: Mood normal.        Behavior: Behavior normal.     Ortho Exam  Imaging: Epidural Steroid injection Result Date: 08/19/2023 Tyrell Antonio, MD     08/19/2023  2:18 PM Lumbar Epidural Steroid Injection - Interlaminar Approach with Fluoroscopic Guidance Patient: Tracy Frye     Date of Birth: June 16, 1955 MRN: [id number] PCP: Sigmund Hazel, MD     Visit Date: 08/19/2023  Universal Protocol:   Consent Given By: the patient Position: PRONE Additional Comments: Vital signs were monitored before and after the procedure. Patient was prepped  and draped in the usual sterile fashion. The correct patient, procedure, and site was verified. Injection Procedure Details: Procedure diagnoses: Lumbar radiculopathy [M54.16] Meds Administered: Meds ordered this encounter Medications  methylPREDNISolone acetate (DEPO-MEDROL) injection 40 mg  Laterality: Right Location/Site:  L4-5 Needle: 3.5 in., 20 ga. Tuohy Needle Placement: Paramedian epidural Findings:  -Comments: Excellent flow of  contrast into the epidural space. Procedure Details: Using a paramedian approach from the side mentioned above, the region overlying the inferior lamina was localized under fluoroscopic visualization and the soft tissues overlying this structure were infiltrated with 4 ml. of 1% Lidocaine without Epinephrine. The Tuohy needle was inserted into the epidural space using a paramedian approach. The epidural space was localized using loss of resistance along with counter oblique bi-planar fluoroscopic views.  After negative aspirate for air, blood, and CSF, a 2 ml. volume of Isovue-250 was injected into the epidural space and the flow of contrast was observed. Radiographs were obtained for documentation purposes.  The injectate was administered into the level noted above. Additional Comments: The patient tolerated the procedure well Dressing: 2 x 2 sterile gauze and Band-Aid  Post-procedure details: Patient was observed during the procedure. Post-procedure instructions were reviewed. Patient left the clinic in stable condition.   Past Medical/Family/Surgical/Social History: Medications & Allergies reviewed per EMR, new medications updated. 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 No pertinent past medical history. History reviewed. No pertinent family history. History reviewed. No  pertinent surgical history. Social History   Occupational History   Not on file  Tobacco Use   Smoking status: Never   Smokeless tobacco: Never  Substance and Sexual Activity   Alcohol use: No   Drug use: Not on file   Sexual activity: Not on file

## 2023-09-27 ENCOUNTER — Telehealth: Payer: Self-pay | Admitting: Orthopaedic Surgery

## 2023-09-27 NOTE — Telephone Encounter (Signed)
 Pt called requesting referral be sent for MRI for back. Please call pt at [phone].

## 2023-10-01 ENCOUNTER — Encounter: Payer: Self-pay | Admitting: Emergency Medicine

## 2023-10-01 ENCOUNTER — Other Ambulatory Visit: Payer: Self-pay

## 2023-10-01 ENCOUNTER — Ambulatory Visit
Admission: EM | Admit: 2023-10-01 | Discharge: 2023-10-01 | Disposition: A | Discharge status: Home / Self Care | Attending: Family Medicine | Admitting: Family Medicine

## 2023-10-01 ENCOUNTER — Ambulatory Visit (INDEPENDENT_AMBULATORY_CARE_PROVIDER_SITE_OTHER)

## 2023-10-01 DIAGNOSIS — R051 Acute cough: Secondary | ICD-10-CM | POA: Diagnosis not present

## 2023-10-01 DIAGNOSIS — J069 Acute upper respiratory infection, unspecified: Secondary | ICD-10-CM

## 2023-10-01 LAB — POCT INFLUENZA A/B
Influenza A, POC: NEGATIVE
Influenza B, POC: NEGATIVE

## 2023-10-01 LAB — POC SARS CORONAVIRUS 2 AG -  ED: SARS Coronavirus 2 Ag: NEGATIVE

## 2023-10-01 MED ORDER — BENZONATATE 200 MG PO CAPS: 200.0000 mg | ORAL_CAPSULE | Freq: Three times a day (TID) | ORAL | 0 refills | Status: DC | PRN

## 2023-10-01 MED ORDER — ALBUTEROL SULFATE HFA 108 (90 BASE) MCG/ACT IN AERS: 2.0000 | INHALATION_SPRAY | RESPIRATORY_TRACT | 0 refills | Status: DC | PRN

## 2023-10-01 NOTE — ED Triage Notes (Signed)
 Pt sts cough, congestion, chills and possible fever x 4 days

## 2023-10-01 NOTE — Discharge Instructions (Addendum)
 You were seen today for upper respiratory symptoms.  Your chest xray appears normal.  If the radiologist reads this differently we will notify you.  Your flu/covid swab was negative as well.  Your symptoms appear to be viral.  I have sent out a medication to help with cough, as well as an inhaler to help with feeling shortness of breath at times.  You may use tylenol /motrin for pain/fevers, and warm salt water gargles for sore throat.  Please get rest and fluids.  You may return if not improving or worsening.

## 2023-10-01 NOTE — ED Provider Notes (Signed)
 [facility]    CSN: [id number] Arrival date & time: 10/01/23  0936      History   Chief Complaint Chief Complaint  Patient presents with   Cough    HPI Tracy Frye is a 68 y.o. female.    Cough Associated symptoms: fever, rhinorrhea, sore throat and wheezing    Patient is here for 4 days of URI symptoms.  Having sore throat, headache, cough, chest  congestion, lost voice.  Fever, low grade.  Her granddaughter was sick last week.  At night having some wheezing, sob.  No h/o asthma, copd.  No n/v.  She has been using advil only.  She is using a white board today due to sore throat/hoarseness.        History reviewed. No pertinent past medical history.  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 No pertinent surgical history.  OB History   No obstetric history on file.      Home Medications    Prior to Admission medications   Medication Sig Start Date End Date Taking? Authorizing Provider  alendronate (FOSAMAX) 70 MG tablet Take 70 mg by mouth once a week. Take with a full glass of water on an empty stomach.    [provider]  cholestyramine (QUESTRAN) 4 g packet TAKE 1 PACKET (4 G TOTAL) BY MOUTH DAILY AS NEEDED FOR UP TO 30 DAYS. 03/01/18   [provider]  cyclobenzaprine  (FLEXERIL ) 10 MG tablet Take 1 tablet (10 mg total) by mouth 3 (three) times daily as needed for muscle spasms. 09/12/22   Sikora, Rebecca, DPM  diazepam  (VALIUM ) 5 MG tablet Take one tablet by mouth with food one hour prior to procedure. May repeat 30 minutes prior if needed. 11/16/21   Williams, Megan E, NP  meloxicam  (MOBIC ) 15 MG tablet Take 1 tablet (15 mg total) by mouth daily. 03/02/14   Orval Blanc, DPM  methocarbamol  (ROBAXIN ) 500 MG tablet  Take 1 tablet (500 mg total) by mouth every 8 (eight) hours as needed for muscle spasms. 08/19/23   Bridget Campion, MD  Turmeric 500 MG CAPS 2 tablets Orally daily    [provider]    Family History History reviewed. No pertinent family history.  Social History Social History   Tobacco Use   Smoking status: Never   Smokeless tobacco: Never  Substance Use Topics   Alcohol use: No     Allergies   Sulfa antibiotics   Review of Systems Review of Systems  Constitutional:  Positive for fatigue and fever.  HENT:  Positive for congestion, rhinorrhea and sore throat.   Respiratory:  Positive for cough and wheezing.   Cardiovascular: Negative.   Gastrointestinal: Negative.   Musculoskeletal: Negative.   Psychiatric/Behavioral: Negative.       Physical Exam Triage Vital Signs ED Triage Vitals  Encounter Vitals Group     BP 10/01/23 1018 133/85     Systolic BP Percentile --      Diastolic BP Percentile --      Pulse Rate 10/01/23 1018 92     Resp 10/01/23 1018 18     Temp 10/01/23 1018 97.9 F (36.6 C)     Temp Source 10/01/23 1018 Oral     SpO2 10/01/23 1018 95 %  Weight --      Height --      Head Circumference --      Peak Flow --      Pain Score 10/01/23 1019 5     Pain Loc --      Pain Education --      Exclude from Growth Chart --    No data found.  Updated Vital Signs BP 133/85 (BP Location: Left Arm)   Pulse 92   Temp 97.9 F (36.6 C) (Oral)   Resp 18   SpO2 95%   Visual Acuity Right Eye Distance:   Left Eye Distance:   Bilateral Distance:    Right Eye Near:   Left Eye Near:    Bilateral Near:     Physical Exam Constitutional:      General: She is not in acute distress.    Appearance: Normal appearance. She is normal weight. She is not ill-appearing or toxic-appearing.  HENT:     Nose: Congestion and rhinorrhea present.     Right Sinus: Maxillary sinus tenderness present.     Left Sinus: Maxillary sinus tenderness present.      Mouth/Throat:     Mouth: Mucous membranes are moist.     Pharynx: Posterior oropharyngeal erythema present. No oropharyngeal exudate.  Cardiovascular:     Rate and Rhythm: Normal rate and regular rhythm.  Pulmonary:     Effort: Pulmonary effort is normal.     Breath sounds: Normal breath sounds. No wheezing.  Musculoskeletal:     Cervical back: Normal range of motion and neck supple. No tenderness.  Lymphadenopathy:     Cervical: No cervical adenopathy.  Skin:    General: Skin is warm.  Neurological:     General: No focal deficit present.     Mental Status: She is alert.  Psychiatric:        Mood and Affect: Mood normal.      UC Treatments / Results  Labs (all labs ordered are listed, but only abnormal results are displayed) Labs Reviewed  POCT INFLUENZA A/B - Normal  POC SARS CORONAVIRUS 2 AG -  ED - Normal  POC COVID19/FLU A&B COMBO    EKG   Radiology No results found.  Procedures Procedures (including critical care time)  Medications Ordered in UC Medications - No data to display  Initial Impression / Assessment and Plan / UC Course  I have reviewed the triage vital signs and the nursing notes.  Pertinent labs & imaging results that were available during my care of the patient were reviewed by me and considered in my medical decision making (see chart for details).    Final Clinical Impressions(s) / UC Diagnoses   Final diagnoses:  Acute cough  Viral URI with cough     Discharge Instructions      You were seen today for upper respiratory symptoms.  Your chest xray appears normal.  If the radiologist reads this differently we will notify you.  Your flu/covid swab was negative as well.  Your symptoms appear to be viral.  I have sent out a medication to help with cough, as well as an inhaler to help with feeling shortness of breath at times.  You may use tylenol /motrin for pain/fevers, and warm salt water gargles for sore throat.  Please get rest  and fluids.  You may return if not improving or worsening.   ED Prescriptions     Medication Sig Dispense Auth. Provider   benzonatate (TESSALON) 200 MG  capsule Take 1 capsule (200 mg total) by mouth 3 (three) times daily as needed for cough. 21 capsule Lavarius Doughten, MD   albuterol (VENTOLIN HFA) 108 (90 Base) MCG/ACT inhaler Inhale 2 puffs into the lungs every 4 (four) hours as needed for wheezing or shortness of breath. 1 each Lesle Ras, MD      PDMP not reviewed this encounter.   Lesle Ras, MD 10/01/23 1100

## 2023-10-09 ENCOUNTER — Ambulatory Visit (INDEPENDENT_AMBULATORY_CARE_PROVIDER_SITE_OTHER): Admitting: Physical Medicine and Rehabilitation

## 2023-10-09 ENCOUNTER — Encounter: Payer: Self-pay | Admitting: Physical Medicine and Rehabilitation

## 2023-10-09 VITALS — BP 114/78 | HR 75

## 2023-10-09 DIAGNOSIS — M48061 Spinal stenosis, lumbar region without neurogenic claudication: Secondary | ICD-10-CM | POA: Diagnosis not present

## 2023-10-09 DIAGNOSIS — M47816 Spondylosis without myelopathy or radiculopathy, lumbar region: Secondary | ICD-10-CM

## 2023-10-09 DIAGNOSIS — M5416 Radiculopathy, lumbar region: Secondary | ICD-10-CM

## 2023-10-09 MED ORDER — METHOCARBAMOL 500 MG PO TABS: 500.0000 mg | ORAL_TABLET | Freq: Three times a day (TID) | ORAL | 0 refills | Status: DC

## 2023-10-09 NOTE — Progress Notes (Unsigned)
 Tracy Frye - 68 y.o. female MRN [id number]  Date of birth: 1955-09-06  Office Visit Note: Visit Date: 10/09/2023 PCP: Perley Bradley, MD Referred by: Perley Bradley, MD  Subjective: No chief complaint on file.  HPI: Tracy Frye is a 68 y.o. female who comes in today for evaluation of chronic, worsening and severe bilateral lower back pain. We have seen her in the past for more right sided lower back, hip and thigh pain. She reports new symptoms of bilateral axial back pain for about 2 months. Her pain worsens with twisting and performing household activities. Describes her pain as spasm sensation, currently rates as 10 out of 10. Some relief of pain with home exercise regimen, rest and use of medications. She does take Robaxin  as needed. History of formal physical therapy many years ago with minimal relief of pain. Lumbar MRI imaging from 2018 shows multi level degenerative changes and facet arthropathy. There is up to moderate lateral recess and foraminal stenosis on the right at L4-L5. Patient has undergone multiple lumbar epidural steroid injections in our office over the years. She reports significant relief of pain with these injections, typically 6 or more months. More recently, we performed right L4-L5 interlaminar epidural steroid injection on 08/19/2023, she reports 100% pain relief for about 1 month. She is here today with different issues and would like to discuss options. Patient denies focal weakness, numbness and tingling. No recent trauma or falls.      Review of Systems  Musculoskeletal:  Positive for back pain.  Neurological:  Negative for tingling, sensory change, focal weakness and weakness.  All other systems reviewed and are negative.  Otherwise per HPI.  Assessment & Plan: Visit Diagnoses:    ICD-10-CM   1. Lumbar radiculopathy  M54.16 MR LUMBAR SPINE WO CONTRAST    2. Spondylosis without myelopathy or radiculopathy, lumbar region  M47.816 MR LUMBAR SPINE  WO CONTRAST    3. Bilateral stenosis of lateral recess of lumbar spine  M48.061 MR LUMBAR SPINE WO CONTRAST       Plan: Findings:  Chronic, worsening and severe bilateral axial back pain.  No radicular symptoms down the legs.  Historically, her pain has been mostly right-sided radiating to her hip and thigh region, new symptoms of bilateral lower back pain started about 2 months ago.  She continues to have pain despite good conservative therapy such as formal physical therapy, home exercise regimen, rest and use of medications.  Patient's clinical presentation and exam are most consistent with facet mediated pain.  She does have pain with lumbar extension and when standing to perform household tasks.  Prior lumbar MRI imaging from 2018 does show multilevel degenerative changes and facet arthropathy.  Most recent lumbar epidural steroid injection in March of this year provided good relief of pain for short period of time.  We discussed treatment plan in detail today, next step is to order new lumbar MRI imaging.  Depending on results of imaging we would consider performing lumbar injections.  I also spoke with her about regrouping with formal physical therapy as I do think she would benefit from core strengthening exercises.  I did refill Robaxin  for her today.  She has no questions at this time.  I will see her back for lumbar MRI review and to discuss treatment options.  No red flag symptoms noted upon exam today.    Meds & Orders:  Meds ordered this encounter  Medications   methocarbamol  (ROBAXIN ) 500 MG tablet  Sig: Take 1 tablet (500 mg total) by mouth 3 (three) times daily.    Dispense:  90 tablet    Refill:  0    Orders Placed This Encounter  Procedures   MR LUMBAR SPINE WO CONTRAST    Follow-up: Return for Lumbar MRI review.   Procedures: No procedures performed      Clinical History: MRI LUMBAR SPINE WITHOUT CONTRAST    TECHNIQUE:  Multiplanar, multisequence MR imaging of the  lumbar spine was  performed. No intravenous contrast was administered.    COMPARISON:  Lumbar radiographs 01/15/2017.    FINDINGS:  Segmentation: Normal lumbar segmentation demonstrated on the  comparison radiographs.    Alignment: Moderate dextroconvex lumbar scoliosis. Straightening of  lumbar lordosis. Mild retrolisthesis at L4-L5 and L5-S1.    Vertebrae: Chronic degenerative endplate marrow signal changes at  L4-L5. Background bone marrow signal is within normal limits. No  marrow edema or evidence of acute osseous abnormality. Intact  visible sacrum and SI joints.    Conus medullaris: Extends to the L1 level and appears normal.    Paraspinal and other soft tissues: Negative.    Disc levels:    T10-T11:  Partially visible mild disc bulge.    T11-12: Mild circumferential disc bulge. Mild to moderate facet  hypertrophy. Mild left and moderate to severe right T11 foraminal  stenosis.    T12-L1: Negative.    L1-L2: Mild circumferential disc bulge with broad-based posterior  component. Mild facet hypertrophy. Mild bilateral L1 foraminal  stenosis.    L2-L3: Mild circumferential disc bulge. Mild facet hypertrophy. No  stenosis.    L3-L4: Mild facet and ligament flavum hypertrophy greater on the  right. Borderline to mild left L3 foraminal stenosis.    L4-L5: Severe disc space loss. Circumferential disc osteophyte  complex with broad-based posterior component. Mild to moderate facet  and ligament flavum hypertrophy. Borderline to mild spinal stenosis.  Mild to moderate right lateral recess and right foraminal stenosis.  Mild left foraminal stenosis.    L5-S1: Disc space loss. Mostly far lateral disc osteophyte complex.  No spinal or lateral recess stenosis. Borderline to mild L5  bilateral foraminal stenosis.    IMPRESSION:  1. Dextroconvex lumbar scoliosis with straightening of lordosis and  mild retrolisthesis at L4-L5 and L5-S1.  2. Advanced chronic L4-L5 and  L5-S1 disc degeneration with endplate  spurring. Up to mild spinal stenosis at L4-L5 associated with up to  moderate right neural foraminal and right lateral recess stenosis at  the right L4 and L5 nerve levels, respectively.  3. Only mild neural foraminal stenosis at L5-S1. Intermittent mild  lumbar foraminal stenosis elsewhere.  4. T11-T12 disc and advanced posterior element degeneration with up  to severe right T11 foraminal stenosis.      Electronically Signed    By: Marlise Simpers M.D.    On: 03/25/2017 08:24   She reports that she has never smoked. She has never used smokeless tobacco. No results for input(s): "HGBA1C", "LABURIC" in the last 8760 hours.  Objective:  VS:  HT:    WT:   BMI:     BP:114/78  HR:75bpm  TEMP: ( )  RESP:  Physical Exam Vitals and nursing note reviewed.  HENT:     Head: Normocephalic and atraumatic.     Right Ear: External ear normal.     Left Ear: External ear normal.     Nose: Nose normal.     Mouth/Throat:     Mouth: Mucous membranes are  moist.  Eyes:     Extraocular Movements: Extraocular movements intact.  Cardiovascular:     Rate and Rhythm: Normal rate.     Pulses: Normal pulses.  Pulmonary:     Effort: Pulmonary effort is normal.  Abdominal:     General: Abdomen is flat. There is no distension.  Musculoskeletal:        General: Tenderness present.     Cervical back: Normal range of motion.     Comments: Patient rises from seated position to standing without difficulty. Pain noted with facet loading and lumbar extension. 5/5 strength noted with bilateral hip flexion, knee flexion/extension, ankle dorsiflexion/plantarflexion and EHL. No clonus noted bilaterally. No pain upon palpation of greater trochanters. No pain with internal/external rotation of bilateral hips. Sensation intact bilaterally. Negative slump test bilaterally. Ambulates without aid, gait steady.     Skin:    General: Skin is warm and dry.     Capillary Refill: Capillary  refill takes less than 2 seconds.  Neurological:     General: No focal deficit present.     Mental Status: She is alert and oriented to person, place, and time.  Psychiatric:        Mood and Affect: Mood normal.        Behavior: Behavior normal.     Ortho Exam  Imaging: No results found.  Past Medical/Family/Surgical/Social History: Medications & Allergies reviewed per EMR, new medications updated. 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 No pertinent past medical history. History reviewed. No pertinent family history. History reviewed. No pertinent surgical history. Social History   Occupational History   Not on file  Tobacco Use   Smoking status: Never   Smokeless tobacco: Never  Substance and Sexual Activity   Alcohol use: No   Drug use: Not on file   Sexual activity: Not on file

## 2023-10-09 NOTE — Progress Notes (Unsigned)
 Patient says that the injection at the end of March gave her complete relief for about 1 month. She says that this is unusual for her, as she typically gets about 6 months of relief. She says that her pain is in the midline of the back, whereas it has been right-sided in the past. She denies any radicular symptoms. She states that she has had to take her Methocarbamol  more often than she usually does.

## 2023-10-11 ENCOUNTER — Encounter: Payer: Self-pay | Admitting: Physical Medicine and Rehabilitation

## 2023-10-15 ENCOUNTER — Encounter: Payer: Self-pay | Admitting: Physical Medicine and Rehabilitation

## 2023-10-26 ENCOUNTER — Inpatient Hospital Stay: Admission: RE | Admit: 2023-10-26 | Source: Ambulatory Visit

## 2023-11-02 ENCOUNTER — Ambulatory Visit
Admission: RE | Admit: 2023-11-02 | Discharge: 2023-11-02 | Disposition: A | Discharge status: Home / Self Care | Source: Ambulatory Visit | Attending: Physical Medicine and Rehabilitation | Admitting: Physical Medicine and Rehabilitation

## 2023-11-02 DIAGNOSIS — M48061 Spinal stenosis, lumbar region without neurogenic claudication: Secondary | ICD-10-CM

## 2023-11-02 DIAGNOSIS — M5416 Radiculopathy, lumbar region: Secondary | ICD-10-CM

## 2023-11-02 DIAGNOSIS — M47816 Spondylosis without myelopathy or radiculopathy, lumbar region: Secondary | ICD-10-CM

## 2023-11-18 ENCOUNTER — Telehealth: Payer: Self-pay

## 2023-11-18 ENCOUNTER — Ambulatory Visit: Payer: Self-pay | Admitting: Physical Medicine and Rehabilitation

## 2023-11-18 DIAGNOSIS — N202 Calculus of kidney with calculus of ureter: Secondary | ICD-10-CM

## 2023-11-18 DIAGNOSIS — R1084 Generalized abdominal pain: Secondary | ICD-10-CM

## 2023-11-18 NOTE — Addendum Note (Signed)
 Addended by: ELDONNA GARDINER POUR on: 11/18/2023 03:02 PM   Modules accepted: Orders

## 2023-11-18 NOTE — Telephone Encounter (Signed)
 [facility] wants Tracy Frye to be aware of results from MRI Moderate left hydroureter, increased from prior. Further characterization with CT of the abdomen and pelvis without contrast is recommended to evaluate for urolithiasis.

## 2023-11-20 ENCOUNTER — Ambulatory Visit
Admission: RE | Admit: 2023-11-20 | Discharge: 2023-11-20 | Disposition: A | Discharge status: Home / Self Care | Source: Ambulatory Visit | Attending: Physical Medicine and Rehabilitation | Admitting: Physical Medicine and Rehabilitation

## 2023-11-20 DIAGNOSIS — R1084 Generalized abdominal pain: Secondary | ICD-10-CM

## 2023-11-25 ENCOUNTER — Telehealth: Payer: Self-pay | Admitting: Physical Medicine and Rehabilitation

## 2023-11-25 NOTE — Telephone Encounter (Signed)
 I also called Tracy Frye to update her on results of CT abdomen and pelvis, no answer. I left message for her to call me back.

## 2023-11-25 NOTE — Telephone Encounter (Signed)
 Recent lumbar MRI imaging shows moderate left hydroureter, increased from prior. Further characterization with CT of the abdomen and pelvis without contrast is recommended to evaluate for urolithiasis. Dr. Eldonna ordered follow up CT of abdomen and pelvis with contrast. I received those results today that shows multiseptated, right ovarian cyst, measuring 5.3 x 3.7 x 4.6 cm. Given the patient's age, this is of intermediate risk for a cystic ovarian neoplasm. Gynecologic consultation with a follow-up pelvic MRI with IV contrast recommended for further characterization.  I did contact patients primary care provider Dr. Olam Pinal, left message with her office to call me back. She will likely need referral to gynecology for further evaluation.

## 2023-11-28 ENCOUNTER — Encounter: Payer: Self-pay | Admitting: Gynecologic Oncology

## 2023-11-28 NOTE — Progress Notes (Signed)
 GYNECOLOGIC ONCOLOGY NEW PATIENT CONSULTATION   Patient Name: Tracy Frye  Patient Age: 68 y.o. Date of Service: 11/29/23 Referring Provider: Prentice Batch, FNP  Primary Care Provider: Cleotilde Planas, MD Consulting Provider: Comer Dollar, MD   Assessment/Plan:  Postmenopausal patient with incidental adnexal mass on recent imaging.  We discussed recent imaging findings.  We looked at CT images together.  I reviewed the limitations of CT imaging with regard to adnexal structures.  I recommend that we proceed with pelvic ultrasound to better characterize this adnexal mass.  We discussed some of the features that can be seen on ultrasound that would increase or decrease the suspicion for a borderline tumor or malignancy.  The patient has some mild symptoms.  We discussed that typically a mass of this size would not cause significant symptoms.  Her symptoms started after discovery of this mass, and it is a little challenging to know if more awareness to the area has increased what she is feeling.  We also discussed that likely her back pain and left-sided pain are unrelated to this and more likely related to her chronic back issues as well as her kidney stone.  Plan to get tumor markers today.  We discussed the utility of tumor markers and specifically that they are not sensitive nor specific for diagnosing ovarian borderline tumor or malignancy.  Once her ultrasound has been performed, she and I will have a phone visit to discuss next steps.  We discussed based on lab testing and imaging, we may plan for close imaging follow-up versus surgery.  They are traveling in August for vacation.  We will tentatively hold some time right after they get back from vacation (8/28).  We proceed with surgery, we discussed tentative plan for a robotic assisted bilateral salpingo-oophorectomy, possible staging, possible laparotomy.  Reviewed that there may be some scar tissue related to multiple abdominal  surgeries as well as reported history of endometriosis.  Discussed that plan would be to send the adnexal mass for frozen section to help guide additional procedures.  If borderline tumor found, peritoneal biopsies and omentectomy would be performed.  If malignancy encountered, lymphadenectomy may also be indicated.  She recently saw her primary care provider and had urinalysis done.  There was trace blood seen.  She has not gotten any news about whether there was evidence of a urinary tract infection.  I encouraged her to reach out and to ask with her she needs to continue her antibiotics.  A copy of this note was sent to the patient's referring provider.   60 minutes of total time was spent for this patient encounter, including preparation, face-to-face counseling with the patient and coordination of care, and documentation of the encounter.  Comer Dollar, MD  Division of Gynecologic Oncology  Department of Obstetrics and Gynecology  [facility]  ___________________________________________  Chief Complaint: No chief complaint on file.   History of Present Illness:  Tracy Frye is a 68 y.o. y.o. female who is seen in consultation at the request of Prentice Batch, FNP for an evaluation of an adnexal mass.  Patient has a history of back pain, has previously received steroid injections for this.  Last injection provided relief for less time than his and typical.  MRI was performed in June with finding of moderate left hydroureter, increased from previously.  CT was recommended to evaluate for urolithiasis.  CT of the abdomen and pelvis done for abdominal and flank pain on 11/20/2023 revealed a  multiseptated right ovarian cyst measuring 5.3 x 3.7 x 4.6 cm.  Sigmoid diverticulosis noted no acute diverticulitis.  Developing, staghorn type calculus in the lower pole of the left kidney measuring 11 mm.  Today, the patient presents with her husband.  She notes  ongoing history of back pain for which she sees Dr. Eldonna.  She has gotten steroid injections before.  About 1 month ago, she noticed a change in smell to her urine.  Recently saw her primary care provider to have urine testing done, waiting to hear whether there is an infection.  There was some blood seen in her urine. Two weeks ago, she began having some mild right sided/right pelvic pain.  She endorses a good appetite without nausea or emesis.  She endorses regular bowel function with the use of Metamucil and Colestrin (struggled for about 3 years with diarrhea, symptoms have resolved with these medications).  She denies any urinary symptoms.  Denies any vaginal bleeding or discharge.  Reports a good appetite until recently with some decrease now related to anxiety.  Denies any nausea or emesis, denies early satiety.  Has not had any recent weight changes.  Her surgical history is notable for total hysterectomy for uterine fibroids.  This was done in [place]  in the 1990s.  She has also undergone an appendectomy and a cholecystectomy.  PAST MEDICAL HISTORY:  Past Medical History:  Diagnosis Date   History of endometriosis    Kidney stone    Migraine    Osteopenia    Uterine fibroid    hysterectomy     PAST SURGICAL HISTORY:  Past Surgical History:  Procedure Laterality Date   APPENDECTOMY     CHOLECYSTECTOMY     FOOT SURGERY Left 2019   TONSILECTOMY, ADENOIDECTOMY, BILATERAL MYRINGOTOMY AND TUBES     VAGINAL HYSTERECTOMY     Patient thinks this was done in 88's.    OB/GYN HISTORY:  OB History  Gravida Para Term Preterm AB Living  3 2    2   SAB IAB Ectopic Multiple Live Births          # Outcome Date GA Lbr Len/2nd Weight Sex Type Anes PTL Lv  3 Gravida           2 Para           1 Para             No LMP recorded. Patient has had a hysterectomy.  Age at menarche: 84  Age at menopause: unsure Hx of HRT: denies Hx of STDs: denies Last pap: unsure History of abnormal  pap smears: denies  SCREENING STUDIES:  Last mammogram: 2024  Last colonoscopy: 2018 Last bone mineral density: 2023  MEDICATIONS: Outpatient Encounter Medications as of 11/29/2023  Medication Sig   alendronate (FOSAMAX) 70 MG tablet Take 70 mg by mouth once a week. Take with a full glass of water on an empty stomach.   cholestyramine (QUESTRAN) 4 g packet TAKE 1 PACKET (4 G TOTAL) BY MOUTH DAILY AS NEEDED FOR UP TO 30 DAYS.   ciprofloxacin (CIPRO) 500 MG tablet 1 tablet Orally every 12 hrs; Duration: 7 days   magnesium chloride (SLOW-MAG) 64 MG TBEC SR tablet Take 2 tablets by mouth daily.   methocarbamol  (ROBAXIN ) 500 MG tablet Take 1 tablet (500 mg total) by mouth every 8 (eight) hours as needed for muscle spasms.   psyllium (HYDROCIL/METAMUCIL) 95 % PACK Take 1 packet by mouth daily.   TRI-LUMA 0.01-4-0.05 % CREA  Apply topically at bedtime.   albuterol  (VENTOLIN  HFA) 108 (90 Base) MCG/ACT inhaler Inhale 2 puffs into the lungs every 4 (four) hours as needed for wheezing or shortness of breath. (Patient not taking: Reported on 11/28/2023)   benzonatate  (TESSALON ) 200 MG capsule Take 1 capsule (200 mg total) by mouth 3 (three) times daily as needed for cough. (Patient not taking: Reported on 11/28/2023)   cyclobenzaprine  (FLEXERIL ) 10 MG tablet Take 1 tablet (10 mg total) by mouth 3 (three) times daily as needed for muscle spasms. (Patient not taking: Reported on 11/28/2023)   diazepam  (VALIUM ) 5 MG tablet Take one tablet by mouth with food one hour prior to procedure. May repeat 30 minutes prior if needed. (Patient not taking: Reported on 11/28/2023)   meloxicam  (MOBIC ) 15 MG tablet Take 1 tablet (15 mg total) by mouth daily. (Patient not taking: Reported on 11/28/2023)   methocarbamol  (ROBAXIN ) 500 MG tablet Take 1 tablet (500 mg total) by mouth 3 (three) times daily.   Turmeric 500 MG CAPS 2 tablets Orally daily (Patient not taking: Reported on 11/28/2023)   Facility-Administered Encounter  Medications as of 11/29/2023  Medication   triamcinolone  acetonide (KENALOG ) 10 MG/ML injection 10 mg   triamcinolone  acetonide (KENALOG ) 10 MG/ML injection 10 mg   triamcinolone  acetonide (KENALOG ) 10 MG/ML injection 10 mg   triamcinolone  acetonide (KENALOG ) 10 MG/ML injection 10 mg   triamcinolone  acetonide (KENALOG -40) injection 20 mg   triamcinolone  acetonide (KENALOG -40) injection 20 mg    ALLERGIES:  Allergies  Allergen Reactions   Sulfa Antibiotics Rash     FAMILY HISTORY:  Family History  Problem Relation Age of Onset   Pancreatic cancer Half-Brother    Breast cancer Neg Hx    Prostate cancer Neg Hx    Colon cancer Neg Hx    Ovarian cancer Neg Hx    Endometrial cancer Neg Hx      SOCIAL HISTORY:  Social Connections: Not on file    REVIEW OF SYSTEMS:  + Blood in urine, joint pain, headache Denies appetite changes, fevers, chills, fatigue, unexplained weight changes. Denies hearing loss, neck lumps or masses, mouth sores, ringing in ears or voice changes. Denies cough or wheezing.  Denies shortness of breath. Denies chest pain or palpitations. Denies leg swelling. Denies abdominal distention, pain, blood in stools, constipation, diarrhea, nausea, vomiting, or early satiety. Denies pain with intercourse, dysuria, frequency or incontinence. Denies hot flashes, pelvic pain, vaginal bleeding or vaginal discharge.   Denies back pain or muscle pain/cramps. Denies itching, rash, or wounds. Denies dizziness, numbness or seizures. Denies swollen lymph nodes or glands, denies easy bruising or bleeding. Denies anxiety, depression, confusion, or decreased concentration.  Physical Exam:  Vital Signs for this encounter:  Blood pressure 134/71, pulse 67, temperature 98.4 F (36.9 C), temperature source Oral, resp. rate 19, height 5' 7 (1.702 m), weight 190 lb (86.2 kg), SpO2 100%. Body mass index is 29.76 kg/m. General: Alert, oriented, no acute distress.  HEENT:  Normocephalic, atraumatic. Sclera anicteric.  Chest: Clear to auscultation bilaterally. No wheezes, rhonchi, or rales. Cardiovascular: Regular rate and rhythm, no murmurs, rubs, or gallops.  Abdomen: Normoactive bowel sounds. Soft, nondistended, nontender to palpation. No masses or hepatosplenomegaly appreciated. No palpable fluid wave.  Well-healed low transverse incision. Extremities: Grossly normal range of motion. Warm, well perfused. No edema bilaterally.  Skin: No rashes or lesions.  Lymphatics: No cervical, supraclavicular, or inguinal adenopathy.  GU:  Normal external female genitalia. No lesions. No discharge or bleeding.  Bladder/urethra:  No lesions or masses, well supported bladder             Vagina: Mildly atrophic, no lesions.             Cervix/uterus: Surgically absent. Adnexa: Some fullness noted within the cul-de-sac on the right with mild tenderness with very deep palpation using the abdominal hand.  This mass is smooth, mobile.  No nodularity appreciated.  Rectal: Deferred.  LABORATORY AND RADIOLOGIC DATA:  Outside medical records were reviewed to synthesize the above history, along with the history and physical obtained during the visit.   No results found for: WBC, HGB, HCT, PLT, GLUCOSE, CHOL, TRIG, HDL, LDLDIRECT, LDLCALC, ALT, AST, NA, K, CL, CREATININE, BUN, CO2, TSH, PSA, INR, GLUF, HGBA1C, MICROALBUR

## 2023-11-29 ENCOUNTER — Ambulatory Visit: Admitting: Gynecologic Oncology

## 2023-11-29 ENCOUNTER — Inpatient Hospital Stay

## 2023-11-29 ENCOUNTER — Encounter: Payer: Self-pay | Admitting: Gynecologic Oncology

## 2023-11-29 ENCOUNTER — Inpatient Hospital Stay: Attending: Gynecologic Oncology | Admitting: Gynecologic Oncology

## 2023-11-29 VITALS — BP 134/71 | HR 67 | Temp 98.4°F | Resp 19 | Ht 67.0 in | Wt 190.0 lb

## 2023-11-29 DIAGNOSIS — M8580 Other specified disorders of bone density and structure, unspecified site: Secondary | ICD-10-CM | POA: Diagnosis not present

## 2023-11-29 DIAGNOSIS — K5730 Diverticulosis of large intestine without perforation or abscess without bleeding: Secondary | ICD-10-CM | POA: Diagnosis not present

## 2023-11-29 DIAGNOSIS — Z86018 Personal history of other benign neoplasm: Secondary | ICD-10-CM | POA: Insufficient documentation

## 2023-11-29 DIAGNOSIS — Z90710 Acquired absence of both cervix and uterus: Secondary | ICD-10-CM | POA: Insufficient documentation

## 2023-11-29 DIAGNOSIS — Z791 Long term (current) use of non-steroidal anti-inflammatories (NSAID): Secondary | ICD-10-CM | POA: Insufficient documentation

## 2023-11-29 DIAGNOSIS — N200 Calculus of kidney: Secondary | ICD-10-CM | POA: Diagnosis not present

## 2023-11-29 DIAGNOSIS — M549 Dorsalgia, unspecified: Secondary | ICD-10-CM | POA: Diagnosis not present

## 2023-11-29 DIAGNOSIS — Z79899 Other long term (current) drug therapy: Secondary | ICD-10-CM | POA: Diagnosis not present

## 2023-11-29 DIAGNOSIS — D3911 Neoplasm of uncertain behavior of right ovary: Secondary | ICD-10-CM | POA: Diagnosis present

## 2023-11-29 DIAGNOSIS — R1903 Right lower quadrant abdominal swelling, mass and lump: Secondary | ICD-10-CM | POA: Diagnosis not present

## 2023-11-29 DIAGNOSIS — Z800 Family history of malignant neoplasm of digestive organs: Secondary | ICD-10-CM | POA: Insufficient documentation

## 2023-11-29 DIAGNOSIS — Z9049 Acquired absence of other specified parts of digestive tract: Secondary | ICD-10-CM | POA: Insufficient documentation

## 2023-11-29 DIAGNOSIS — N83201 Unspecified ovarian cyst, right side: Secondary | ICD-10-CM | POA: Insufficient documentation

## 2023-11-29 LAB — CEA (ACCESS): CEA (CHCC): <1 ng/mL (ref 0.00–5.00)

## 2023-11-29 NOTE — Patient Instructions (Addendum)
 It was very nice to meet you today.  Overall, my concern that this represents a cancer or precancer is low.  We discussed getting some additional information about the mass on or near your right ovary.  This will include getting a pelvic ultrasound as well as some blood work (these blood tests are called tumor markers).  You and I will have a phone visit to discuss all of this information.  We will decide if we are proceeding with surgery in the near future versus watching closely and repeating some imaging in several months.  My office will tentatively hold some time for surgery on August 28, once you have returned from vacation.  If we move forward with surgery, this would include removal of your tubes and ovaries through little incisions.  I would send the mass for the pathologist to look at while you are asleep for surgery to help determine if any other procedures are necessary on the day of surgery.  We are going to go ahead and reserve this time for you on August 28. You may also receive a phone call from the hospital to arrange for a pre-op appointment. This can always be cancelled if decision for surgery is changed.

## 2023-11-30 ENCOUNTER — Ambulatory Visit: Payer: Self-pay | Admitting: Gynecologic Oncology

## 2023-11-30 LAB — CA 125: Cancer Antigen (CA) 125: 3.6 U/mL (ref 0.0–38.1)

## 2023-12-05 ENCOUNTER — Ambulatory Visit (HOSPITAL_COMMUNITY)
Admission: RE | Admit: 2023-12-05 | Discharge: 2023-12-05 | Disposition: A | Discharge status: Home / Self Care | Source: Ambulatory Visit | Attending: Gynecologic Oncology | Admitting: Gynecologic Oncology

## 2023-12-05 DIAGNOSIS — N83201 Unspecified ovarian cyst, right side: Secondary | ICD-10-CM | POA: Diagnosis present

## 2023-12-10 ENCOUNTER — Ambulatory Visit: Admitting: Physical Medicine and Rehabilitation

## 2023-12-10 ENCOUNTER — Telehealth: Payer: Self-pay

## 2023-12-10 NOTE — Telephone Encounter (Signed)
 Spoke with patient and per Dr Viktoria moved the phone visit from 7/25 to 7/23... explained to patient that Dr could call at any time of the day.  Patient confirmed.

## 2023-12-11 ENCOUNTER — Inpatient Hospital Stay (HOSPITAL_BASED_OUTPATIENT_CLINIC_OR_DEPARTMENT_OTHER): Admitting: Gynecologic Oncology

## 2023-12-11 ENCOUNTER — Encounter: Payer: Self-pay | Admitting: Gynecologic Oncology

## 2023-12-11 DIAGNOSIS — N83201 Unspecified ovarian cyst, right side: Secondary | ICD-10-CM | POA: Diagnosis not present

## 2023-12-11 DIAGNOSIS — Z7189 Other specified counseling: Secondary | ICD-10-CM

## 2023-12-11 NOTE — Progress Notes (Signed)
 Gynecologic Oncology Telehealth Note: Gyn-Onc  I connected with Tracy Frye on 12/11/23 at  6:00 PM EDT by telephone and verified that I am speaking with the correct person using two identifiers.  I discussed the limitations, risks, security and privacy concerns of performing an evaluation and management service by telemedicine and the availability of in-person appointments. I also discussed with the patient that there may be a patient responsible charge related to this service. The patient expressed understanding and agreed to proceed.  Other persons participating in the visit and their role in the encounter: none.  Patient's location: [facility] Provider's location: [facility], [place]  Reason for Visit: follow-up  Treatment History: Tracy Frye is a 68 y.o. y.o. female who is seen in consultation at the request of Prentice Batch, FNP for an evaluation of an adnexal mass.   Patient has a history of back pain, has previously received steroid injections for this.  Last injection provided relief for less time than his and typical.  MRI was performed in June with finding of moderate left hydroureter, increased from previously.  CT was recommended to evaluate for urolithiasis.   CT of the abdomen and pelvis done for abdominal and flank pain on 11/20/2023 revealed a multiseptated right ovarian cyst measuring 5.3 x 3.7 x 4.6 cm.  Sigmoid diverticulosis noted no acute diverticulitis.  Developing, staghorn type calculus in the lower pole of the left kidney measuring 11 mm.  Interval History: Doing well.  Past Medical/Surgical History: Past Medical History:  Diagnosis Date   History of endometriosis    Kidney stone    Migraine    Osteopenia    Uterine fibroid    hysterectomy    Past Surgical History:  Procedure Laterality Date   APPENDECTOMY     CHOLECYSTECTOMY     FOOT SURGERY Left 2019   TONSILECTOMY, ADENOIDECTOMY, BILATERAL MYRINGOTOMY AND TUBES     VAGINAL HYSTERECTOMY     Patient  thinks this was done in 32's.    Family History  Problem Relation Age of Onset   Pancreatic cancer Half-Brother    Breast cancer Neg Hx    Prostate cancer Neg Hx    Colon cancer Neg Hx    Ovarian cancer Neg Hx    Endometrial cancer Neg Hx     Social History   Socioeconomic History   Marital status: Married    Spouse name: Not on file   Number of children: Not on file   Years of education: Not on file   Highest education level: Not on file  Occupational History   Occupation: retired  Tobacco Use   Smoking status: Never   Smokeless tobacco: Never  Vaping Use   Vaping status: Never Used  Substance and Sexual Activity   Alcohol use: No   Drug use: Never   Sexual activity: Not on file  Other Topics Concern   Not on file  Social History Narrative   Not on file   Social Drivers of Health   Financial Resource Strain: Not on file  Food Insecurity: Not on file  Transportation Needs: Not on file  Physical Activity: Not on file  Stress: Not on file  Social Connections: Not on file    Current Medications:  Current Outpatient Medications:    alendronate (FOSAMAX) 70 MG tablet, Take 70 mg by mouth once a week. Take with a full glass of water on an empty stomach., Disp: , Rfl:    benzonatate  (TESSALON ) 200 MG capsule, Take 1 capsule (  200 mg total) by mouth 3 (three) times daily as needed for cough. (Patient not taking: Reported on 11/28/2023), Disp: 21 capsule, Rfl: 0   cholestyramine (QUESTRAN) 4 g packet, TAKE 1 PACKET (4 G TOTAL) BY MOUTH DAILY AS NEEDED FOR UP TO 30 DAYS., Disp: , Rfl: 9   ciprofloxacin (CIPRO) 500 MG tablet, 1 tablet Orally every 12 hrs; Duration: 7 days, Disp: , Rfl:    magnesium chloride (SLOW-MAG) 64 MG TBEC SR tablet, Take 2 tablets by mouth daily., Disp: , Rfl:    methocarbamol  (ROBAXIN ) 500 MG tablet, Take 1 tablet (500 mg total) by mouth every 8 (eight) hours as needed for muscle spasms., Disp: 30 tablet, Rfl: 1   psyllium (HYDROCIL/METAMUCIL) 95 %  PACK, Take 1 packet by mouth daily., Disp: , Rfl:    TRI-LUMA 0.01-4-0.05 % CREA, Apply topically at bedtime., Disp: , Rfl:    Turmeric 500 MG CAPS, 2 tablets Orally daily (Patient not taking: Reported on 11/28/2023), Disp: , Rfl:   Current Facility-Administered Medications:    triamcinolone  acetonide (KENALOG ) 10 MG/ML injection 10 mg, 10 mg, Other, Once, Stover, Titorya, DPM   triamcinolone  acetonide (KENALOG ) 10 MG/ML injection 10 mg, 10 mg, Other, Once, Stover, Titorya, DPM   triamcinolone  acetonide (KENALOG ) 10 MG/ML injection 10 mg, 10 mg, Other, Once, Stover, Titorya, DPM   triamcinolone  acetonide (KENALOG ) 10 MG/ML injection 10 mg, 10 mg, Other, Once, Stover, Titorya, DPM   triamcinolone  acetonide (KENALOG -40) injection 20 mg, 20 mg, Other, Once, Stover, Titorya, DPM   triamcinolone  acetonide (KENALOG -40) injection 20 mg, 20 mg, Other, Once, Stover, Titorya, DPM  Review of Symptoms: Pertinent positives as per HPI.  Physical Exam: Deferred given limitations of phone visit.  Laboratory & Radiologic Studies: Pelvic ultrasound 7/17: Complex cystic lesion in the right adnexa by ultrasound measures 4.0 x 3.5 x 3.8 cm. This measures slightly smaller than the prior CT scan but this could be technical. It is difficult to separate this from normal ovarian tissue in the structure very well could be ovarian based on the prior CT scan. A complex or aggressive lesion is in the differential. Recommend either continued short follow-up ultrasound in 12 weeks versus further workup with MRI with and without contrast.   Absent uterus. Left ovary not seen but was quite small on the prior CT.  Assessment & Plan: Tracy Frye is a 68 y.o. woman with complex adnexal mass.  Reviewed recent ultrasound findings with the patient.  Unfortunately, images are not great.  The mass itself looks mostly cystic with some septations.  Given difficulty in assessing the mass, radiologist recommends either  close interval follow-up ultrasound versus MRI.  The patient very much wants to avoid surgery if at all possible.  I recommend that we proceed with MRI of the pelvis now to better delineate any features of the mass that would raise concern for borderline tumor or malignancy.  I will contact her with these results.  Order placed today and message sent to my clinic to get this scheduled.  I discussed the assessment and treatment plan with the patient. The patient was provided with an opportunity to ask questions and all were answered. The patient agreed with the plan and demonstrated an understanding of the instructions.   The patient was advised to call back or see an in-person evaluation if the symptoms worsen or if the condition fails to improve as anticipated.   6 minutes of total time was spent for this patient encounter, including preparation, phone  counseling with the patient and coordination of care, and documentation of the encounter.   Comer Dollar, MD  Division of Gynecologic Oncology  Department of Obstetrics and Gynecology  [facility]

## 2023-12-12 ENCOUNTER — Telehealth: Payer: Self-pay

## 2023-12-12 NOTE — Telephone Encounter (Signed)
 Spoke with patient and gave information for MRI.Tracy Frye scheduled for 7/29 @ 4pm.. patient confirmed

## 2023-12-13 ENCOUNTER — Telehealth: Admitting: Gynecologic Oncology

## 2023-12-16 ENCOUNTER — Ambulatory Visit (HOSPITAL_COMMUNITY)
Admission: RE | Admit: 2023-12-16 | Discharge: 2023-12-16 | Disposition: A | Discharge status: Home / Self Care | Source: Ambulatory Visit | Attending: Gynecologic Oncology | Admitting: Gynecologic Oncology

## 2023-12-16 DIAGNOSIS — N83201 Unspecified ovarian cyst, right side: Secondary | ICD-10-CM | POA: Diagnosis present

## 2023-12-16 MED ORDER — GADOBUTROL 1 MMOL/ML IV SOLN
8.0000 mL | Freq: Once | INTRAVENOUS | Status: AC | PRN
  Administered 2023-12-16: 8 mL via INTRAVENOUS

## 2023-12-17 ENCOUNTER — Ambulatory Visit (HOSPITAL_COMMUNITY): Admission: RE | Admit: 2023-12-17 | Source: Ambulatory Visit

## 2023-12-17 NOTE — Progress Notes (Signed)
 Called the patient to discuss MRI results. Given some features that raise the concern for a borderline tumor or malignancy, recommendation is in favor of surgery for diagnosis. Patient amenable. She was tentatively scheduled for surgery at the end of August. Will proceed as scheduled.  Comer Dollar MD

## 2023-12-23 ENCOUNTER — Telehealth: Payer: Self-pay | Admitting: Oncology

## 2023-12-23 NOTE — Telephone Encounter (Signed)
 Called Mistina to see if she would like to move her surgery to 12/25/23 with Dr. Viktoria.  She declined because she has a vacation planned for 8/16 and would like to have surgery afterwards.

## 2023-12-31 ENCOUNTER — Telehealth: Payer: Self-pay | Admitting: Oncology

## 2023-12-31 NOTE — Telephone Encounter (Signed)
 Tracy Frye and asked if she would like to move her surgery with Dr. Viktoria up to tomorrow.  She is not able to because she will be going on vacation on 01/04/24.

## 2024-01-01 ENCOUNTER — Ambulatory Visit: Admitting: Orthopaedic Surgery

## 2024-01-11 NOTE — Patient Instructions (Signed)
 SURGICAL WAITING ROOM VISITATION Patients having surgery or a procedure may have no more than 2 support people in the waiting area - these visitors may rotate in the visitor waiting room.   If the patient needs to stay at the hospital during part of their recovery, the visitor guidelines for inpatient rooms apply.  PRE-OP VISITATION  Pre-op nurse will coordinate an appropriate time for 1 support person to accompany the patient in pre-op.  This support person may not rotate.  This visitor will be contacted when the time is appropriate for the visitor to come back in the pre-op area.  Please refer to the [facility] website for the visitor guidelines for Inpatients (after your surgery is over and you are in a regular room).  You are not required to quarantine at this time prior to your surgery. However, you must do this: Hand Hygiene often Do NOT share personal items Notify your provider if you are in close contact with someone who has COVID or you develop fever 100.4 or greater, new onset of sneezing, cough, sore throat, shortness of breath or body aches.  If you test positive for Covid or have been in contact with anyone that has tested positive in the last 10 days please notify you surgeon.    Your procedure is scheduled on:  Thursday  January 16, 2024  Report to [facility] Main Entrance: [place] entrance where the [employer] is available.   Report to admitting at: 05:15   AM  Call this number if you have any questions or problems the morning of surgery [phone]  FOLLOW ANY ADDITIONAL PRE OP INSTRUCTIONS YOU RECEIVED FROM YOUR SURGEON'S OFFICE!!!  Eat a light diet the day before surgery.  Examples including soups, broths, toast, yogurt, mashed potatoes.  AVOID GAS PRODUCING FOODS. Things to avoid include carbonated beverages (fizzy beverages, sodas), raw fruits and raw vegetables (uncooked), or beans.    Do not eat food after Midnight the night prior to your  surgery/procedure.  After Midnight you may have the following liquids until  04:30 AM DAY OF SURGERY  Clear Liquid Diet Water  Black Coffee (sugar ok, NO MILK/CREAM OR CREAMERS)  Tea (sugar ok, NO MILK/CREAM OR CREAMERS) regular and decaf                             Plain Jell-O  with no fruit (NO RED)                                           Fruit ices (not with fruit pulp, NO RED)                                     Popsicles (NO RED)                                                                  Juice: NO CITRUS JUICES: only apple, WHITE grape, WHITE cranberry Sports drinks like Gatorade or Powerade (NO RED)  Oral Hygiene is also important to reduce your risk of infection.        Remember - BRUSH YOUR TEETH THE MORNING OF SURGERY WITH YOUR REGULAR TOOTHPASTE  Do NOT smoke after Midnight the night before surgery.  STOP TAKING all Vitamins, Herbs and supplements 1 week before your surgery.   Take ONLY these medicines the morning of surgery with A SIP OF WATER :  none                  You may not have any metal on your body including hair pins, jewelry, and body piercing  Do not wear make-up, lotions, powders, perfumes or deodorant  Do not wear nail polish including gel and S&S, artificial / acrylic nails, or any other type of covering on natural nails including finger and toenails. If you have artificial nails, gel coating, etc., that needs to be removed by a nail salon, Please have this removed prior to surgery. Not doing so may mean that your surgery could be cancelled or delayed if the Surgeon or anesthesia staff feels like they are unable to monitor you safely.   Do not shave 48 hours prior to surgery to avoid nicks in your skin which may contribute to postoperative infections.   Contacts, Hearing Aids, dentures or bridgework may not be worn into surgery. DENTURES WILL BE REMOVED PRIOR TO SURGERY PLEASE DO NOT APPLY Poly grip OR ADHESIVES!!!  Patients  discharged on the day of surgery will not be allowed to drive home.  Someone NEEDS to stay with you for the first 24 hours after anesthesia.  Do not bring your home medications to the hospital. The Pharmacy will dispense medications listed on your medication list to you during your admission in the Hospital.  Please read over the following fact sheets you were given: IF YOU HAVE QUESTIONS ABOUT YOUR PRE-OP INSTRUCTIONS, PLEASE CALL [phone]   [facility] - Preparing for Surgery Before surgery, you can play an important role.  Because skin is not sterile, your skin needs to be as free of germs as possible.  You can reduce the number of germs on your skin by washing with CHG (chlorahexidine gluconate) soap before surgery.  CHG is an antiseptic cleaner which kills germs and bonds with the skin to continue killing germs even after washing. Please DO NOT use if you have an allergy to CHG or antibacterial soaps.  If your skin becomes reddened/irritated stop using the CHG and inform your nurse when you arrive at Short Stay. Do not shave (including legs and underarms) for at least 48 hours prior to the first CHG shower.  You may shave your face/neck.  Please follow these instructions carefully:  1.  Shower with CHG Soap the night before surgery and the  morning of surgery.  2.  If you choose to wash your hair, wash your hair first as usual with your normal  shampoo.  3.  After you shampoo, rinse your hair and body thoroughly to remove the shampoo.                             4.  Use CHG as you would any other liquid soap.  You can apply chg directly to the skin and wash.  Gently with a scrungie or clean washcloth.  5.  Apply the CHG Soap to your body ONLY FROM THE NECK DOWN.   Do not use on face/ open  Wound or open sores. Avoid contact with eyes, ears mouth and genitals (private parts).                       Wash face,  Genitals (private parts) with your normal soap.              6.  Wash thoroughly, paying special attention to the area where your  surgery  will be performed.  7.  Thoroughly rinse your body with warm water  from the neck down.  8.  DO NOT shower/wash with your normal soap after using and rinsing off the CHG Soap.            9.  Pat yourself dry with a clean towel.            10.  Wear clean pajamas.            11.  Place clean sheets on your bed the night of your first shower and do not  sleep with pets.  ON THE DAY OF SURGERY : Do not apply any lotions/deodorants the morning of surgery.  Please wear clean clothes to the hospital/surgery center.     FAILURE TO FOLLOW THESE INSTRUCTIONS MAY RESULT IN THE CANCELLATION OF YOUR SURGERY  PATIENT SIGNATURE_________________________________  NURSE SIGNATURE__________________________________  ________________________________________________________________________

## 2024-01-11 NOTE — Progress Notes (Incomplete Revision)
 COVID Vaccine received:  []  No []  Yes Date of any COVID positive Test in last 90 days:  PCP - Olam Pinal, MD [phone]  Cardiologist -   Chest x-ray - 10-01-2023  2v Epic EKG -  do at PST  01-13-2024 Stress Test -  ECHO -  Cardiac Cath -  CT Coronary Calcium score: 0 on 03-29-2020 Epic  Bowel Prep - [x]  No  []   Yes ______  Pacemaker / ICD device [x]  No []  Yes   Spinal Cord Stimulator:[x]  No []  Yes       History of Sleep Apnea? [x]  No []  Yes   CPAP used?- [x]  No []  Yes    Patient has: [x]  NO Hx DM   []  Pre-DM   []  DM1  []   DM2 Does the patient monitor blood sugar?   [x]  N/A   []  No []  Yes   Blood Thinner / Instructions:  none Aspirin Instructions:  none  ERAS Protocol Ordered: []  No  [x]  Yes PRE-SURGERY []  ENSURE  []  G2   [x]  No Drink Ordered Patient is to be NPO after: 0430  Dental hx: []  Dentures:  []  N/A      []  Bridge or Partial:                   []  Loose or Damaged teeth:   Comments:   Activity level: Able to walk up 2 flights of stairs without becoming significantly short of breath or having chest pain?  []  No   []    Yes  Patient can perform ADLs without assistance. []  No   []   Yes  Anesthesia review: HTN no meds, Fatty liver  Patient denies any S&S of respiratory illness or Covid - no shortness of breath, fever, cough or chest pain at PAT appointment.  Patient verbalized understanding and agreement to the Pre-Surgical Instructions that were given to them at this PAT appointment. Patient was also educated of the need to review these PAT instructions again prior to his/her surgery.I reviewed the appropriate phone numbers to call if they have any and questions or concerns.

## 2024-01-11 NOTE — Progress Notes (Signed)
 COVID Vaccine received:  []  No [x]  Yes Date of any COVID positive Test in last 90 days:  none  PCP - Olam Pinal, MD [phone]  Cardiologist - none  Chest x-ray - 10-01-2023  2v Epic EKG -  do at PST  01-13-2024 Stress Test -  ECHO -  Cardiac Cath -  CT Coronary Calcium score: 0 on 03-29-2020 Epic  Bowel Prep - [x]  No  []   Yes ______  Pacemaker / ICD device [x]  No []  Yes   Spinal Cord Stimulator:[x]  No []  Yes       History of Sleep Apnea? [x]  No []  Yes   CPAP used?- [x]  No []  Yes    Patient has: [x]  NO Hx DM   []  Pre-DM   []  DM1  []   DM2 Does the patient monitor blood sugar?   [x]  N/A   []  No []  Yes   Blood Thinner / Instructions:  none Aspirin Instructions:  none  ERAS Protocol Ordered: []  No  [x]  Yes PRE-SURGERY []  ENSURE  []  G2   [x]  No Drink Ordered Patient is to be NPO after: 0430  Dental hx: []  Dentures:  [x]  N/A      []  Bridge or Partial:                   []  Loose or Damaged teeth:   Activity level: Able to walk up 2 flights of stairs without becoming significantly short of breath or having chest pain?  []  No   [x]    Yes  Patient can perform ADLs without assistance. []  No   [x]   Yes  Anesthesia review: HTN no meds, Fatty liver  Patient denies any S&S of respiratory illness or Covid - no shortness of breath, fever, cough or chest pain at PAT appointment.  Patient verbalized understanding and agreement to the Pre-Surgical Instructions that were given to them at this PAT appointment. Patient was also educated of the need to review these PAT instructions again prior to his/her surgery.I reviewed the appropriate phone numbers to call if they have any and questions or concerns.

## 2024-01-13 ENCOUNTER — Encounter (HOSPITAL_COMMUNITY): Payer: Self-pay

## 2024-01-13 ENCOUNTER — Encounter (HOSPITAL_COMMUNITY)
Admission: RE | Admit: 2024-01-13 | Discharge: 2024-01-13 | Disposition: A | Discharge status: Home / Self Care | Source: Ambulatory Visit | Attending: Gynecologic Oncology

## 2024-01-13 ENCOUNTER — Other Ambulatory Visit: Payer: Self-pay

## 2024-01-13 VITALS — BP 130/74 | HR 62 | Temp 97.4°F | Resp 16 | Ht 67.5 in | Wt 185.0 lb

## 2024-01-13 DIAGNOSIS — I10 Essential (primary) hypertension: Secondary | ICD-10-CM | POA: Diagnosis not present

## 2024-01-13 DIAGNOSIS — N83201 Unspecified ovarian cyst, right side: Secondary | ICD-10-CM | POA: Diagnosis not present

## 2024-01-13 DIAGNOSIS — Z01818 Encounter for other preprocedural examination: Secondary | ICD-10-CM | POA: Insufficient documentation

## 2024-01-13 DIAGNOSIS — K760 Fatty (change of) liver, not elsewhere classified: Secondary | ICD-10-CM

## 2024-01-13 HISTORY — DX: Unspecified osteoarthritis, unspecified site: M19.90

## 2024-01-13 HISTORY — DX: Anxiety disorder, unspecified: F41.9

## 2024-01-13 HISTORY — DX: Pneumonia, unspecified organism: J18.9

## 2024-01-13 HISTORY — DX: Fatty (change of) liver, not elsewhere classified: K76.0

## 2024-01-13 HISTORY — DX: Essential (primary) hypertension: I10

## 2024-01-13 HISTORY — DX: Personal history of urinary calculi: Z87.442

## 2024-01-13 LAB — CBC
HCT: 40.6 % (ref 36.0–46.0)
Hemoglobin: 12.5 g/dL (ref 12.0–15.0)
MCH: 22.9 pg — ABNORMAL LOW (ref 26.0–34.0)
MCHC: 30.8 g/dL (ref 30.0–36.0)
MCV: 74.2 fL — ABNORMAL LOW (ref 80.0–100.0)
Platelets: 255 K/uL (ref 150–400)
RBC: 5.47 MIL/uL — ABNORMAL HIGH (ref 3.87–5.11)
RDW: 14.6 % (ref 11.5–15.5)
WBC: 6 K/uL (ref 4.0–10.5)
nRBC: 0 % (ref 0.0–0.2)

## 2024-01-13 LAB — COMPREHENSIVE METABOLIC PANEL WITH GFR
ALT: 24 U/L (ref 0–44)
AST: 16 U/L (ref 15–41)
Albumin: 3.7 g/dL (ref 3.5–5.0)
Alkaline Phosphatase: 60 U/L (ref 38–126)
Anion gap: 9 (ref 5–15)
BUN: 10 mg/dL (ref 8–23)
CO2: 23 mmol/L (ref 22–32)
Calcium: 9 mg/dL (ref 8.9–10.3)
Chloride: 104 mmol/L (ref 98–111)
Creatinine, Ser: 0.77 mg/dL (ref 0.44–1.00)
GFR, Estimated: >60 mL/min (ref >60)
Glucose, Bld: 96 mg/dL (ref 70–99)
Potassium: 4.2 mmol/L (ref 3.5–5.1)
Sodium: 136 mmol/L (ref 135–145)
Total Bilirubin: 0.9 mg/dL (ref 0.0–1.2)
Total Protein: 6.7 g/dL (ref 6.5–8.1)

## 2024-01-14 ENCOUNTER — Encounter: Payer: Self-pay | Admitting: Gynecologic Oncology

## 2024-01-14 ENCOUNTER — Other Ambulatory Visit: Payer: Self-pay | Admitting: Gynecologic Oncology

## 2024-01-14 ENCOUNTER — Telehealth: Payer: Self-pay | Admitting: Oncology

## 2024-01-14 DIAGNOSIS — N83201 Unspecified ovarian cyst, right side: Secondary | ICD-10-CM

## 2024-01-14 MED ORDER — SENNOSIDES-DOCUSATE SODIUM 8.6-50 MG PO TABS: 2.0000 | ORAL_TABLET | Freq: Every day | ORAL | 0 refills | Status: DC

## 2024-01-14 MED ORDER — TRAMADOL HCL 50 MG PO TABS: 50.0000 mg | ORAL_TABLET | Freq: Four times a day (QID) | ORAL | 0 refills | Status: DC | PRN

## 2024-01-14 NOTE — Telephone Encounter (Signed)
 Tracy Frye and reviewed pre op instructions. Let her know that prescriptions for Tramadol  and sennakot have been sent to her pharmacy.  Also reviewed post op appointments with Dr. Viktoria. She verbalized understanding and knows to call with any questions.

## 2024-01-15 ENCOUNTER — Telehealth: Payer: Self-pay | Admitting: *Deleted

## 2024-01-15 NOTE — Telephone Encounter (Signed)
 Attempted to reach patient for pre-op call. Left voicemail requesting call back.

## 2024-01-15 NOTE — Anesthesia Preprocedure Evaluation (Signed)
 Anesthesia Evaluation  Patient identified by MRN, date of birth, ID band Patient awake    Reviewed: Allergy & Precautions, NPO status , Patient's Chart, lab work & pertinent test results  Airway Mallampati: II   Neck ROM: Full  Mouth opening: Limited Mouth Opening  Dental  (+) Teeth Intact, Dental Advisory Given   Pulmonary    breath sounds clear to auscultation       Cardiovascular hypertension,  Rhythm:Regular Rate:Normal     Neuro/Psych  Headaches  Anxiety        GI/Hepatic negative GI ROS, Neg liver ROS,,,  Endo/Other  negative endocrine ROS    Renal/GU negative Renal ROS     Musculoskeletal  (+) Arthritis ,    Abdominal   Peds  Hematology negative hematology ROS (+)   Anesthesia Other Findings   Reproductive/Obstetrics                              Anesthesia Physical Anesthesia Plan  ASA: 2  Anesthesia Plan: General   Post-op Pain Management: Tylenol  PO (pre-op)* and Toradol  IV (intra-op)*   Induction: Intravenous  PONV Risk Score and Plan: 4 or greater and Ondansetron , Dexamethasone , Midazolam  and Scopolamine patch - Pre-op  Airway Management Planned: Oral ETT  Additional Equipment: None  Intra-op Plan:   Post-operative Plan: Extubation in OR  Informed Consent: I have reviewed the patients History and Physical, chart, labs and discussed the procedure including the risks, benefits and alternatives for the proposed anesthesia with the patient or authorized representative who has indicated his/her understanding and acceptance.     Dental advisory given  Plan Discussed with: CRNA  Anesthesia Plan Comments:          Anesthesia Quick Evaluation

## 2024-01-15 NOTE — Telephone Encounter (Signed)
 Telephone call to check on pre-operative status.  Patient compliant with pre-operative instructions.  Reinforced nothing to eat after midnight. Clear liquids until 0515. Patient to arrive at 85.  No questions or concerns voiced.  Instructed to call for any needs.

## 2024-01-16 ENCOUNTER — Ambulatory Visit (HOSPITAL_COMMUNITY): Payer: Self-pay | Admitting: Medical

## 2024-01-16 ENCOUNTER — Encounter (HOSPITAL_COMMUNITY)
Admission: RE | Disposition: A | Discharge status: Home / Self Care | Payer: Self-pay | Source: Home / Self Care | Attending: Gynecologic Oncology

## 2024-01-16 ENCOUNTER — Ambulatory Visit (HOSPITAL_COMMUNITY)
Admission: RE | Admit: 2024-01-16 | Discharge: 2024-01-16 | Disposition: A | Discharge status: Home / Self Care | Attending: Gynecologic Oncology | Admitting: Gynecologic Oncology

## 2024-01-16 ENCOUNTER — Ambulatory Visit (HOSPITAL_COMMUNITY): Payer: Self-pay | Admitting: Anesthesiology

## 2024-01-16 ENCOUNTER — Encounter (HOSPITAL_COMMUNITY): Payer: Self-pay | Admitting: Gynecologic Oncology

## 2024-01-16 DIAGNOSIS — D270 Benign neoplasm of right ovary: Secondary | ICD-10-CM

## 2024-01-16 DIAGNOSIS — N9489 Other specified conditions associated with female genital organs and menstrual cycle: Secondary | ICD-10-CM | POA: Diagnosis present

## 2024-01-16 DIAGNOSIS — R1909 Other intra-abdominal and pelvic swelling, mass and lump: Secondary | ICD-10-CM | POA: Diagnosis not present

## 2024-01-16 DIAGNOSIS — N736 Female pelvic peritoneal adhesions (postinfective): Secondary | ICD-10-CM | POA: Diagnosis not present

## 2024-01-16 DIAGNOSIS — K660 Peritoneal adhesions (postprocedural) (postinfection): Secondary | ICD-10-CM

## 2024-01-16 DIAGNOSIS — Z9049 Acquired absence of other specified parts of digestive tract: Secondary | ICD-10-CM

## 2024-01-16 DIAGNOSIS — I10 Essential (primary) hypertension: Secondary | ICD-10-CM | POA: Insufficient documentation

## 2024-01-16 DIAGNOSIS — N83201 Unspecified ovarian cyst, right side: Secondary | ICD-10-CM

## 2024-01-16 HISTORY — PX: ROBOTIC ASSISTED LAPAROSCOPIC LYSIS OF ADHESION: SHX6080

## 2024-01-16 HISTORY — PX: ROBOTIC ASSISTED BILATERAL SALPINGO OOPHERECTOMY: SHX6078

## 2024-01-16 HISTORY — PX: XI ROBOTIC LAPAROSCOPIC ASSISTED APPENDECTOMY: SHX6877

## 2024-01-16 LAB — TYPE AND SCREEN
ABO/RH(D): O POS
Antibody Screen: NEGATIVE

## 2024-01-16 LAB — ABO/RH: ABO/RH(D): O POS

## 2024-01-16 SURGERY — SALPINGO-OOPHORECTOMY, BILATERAL, ROBOT-ASSISTED
Anesthesia: General | Laterality: Bilateral

## 2024-01-16 MED ORDER — MEPERIDINE HCL 100 MG/ML IJ SOLN: 6.2500 mg | INTRAMUSCULAR | Status: DC | PRN

## 2024-01-16 MED ORDER — DEXAMETHASONE SODIUM PHOSPHATE 10 MG/ML IJ SOLN
INTRAMUSCULAR | Status: AC
  Filled 2024-01-16: qty 1

## 2024-01-16 MED ORDER — LIDOCAINE HCL (PF) 2 % IJ SOLN
INTRAMUSCULAR | Status: AC
  Filled 2024-01-16: qty 5

## 2024-01-16 MED ORDER — SODIUM CHLORIDE 0.9 % IV SOLN
2.0000 g | Freq: Once | INTRAVENOUS | Status: DC
  Filled 2024-01-16: qty 2

## 2024-01-16 MED ORDER — STERILE WATER FOR IRRIGATION IR SOLN
Status: DC | PRN
  Administered 2024-01-16: 1000 mL

## 2024-01-16 MED ORDER — ROCURONIUM BROMIDE 10 MG/ML (PF) SYRINGE
PREFILLED_SYRINGE | INTRAVENOUS | Status: DC | PRN
  Administered 2024-01-16: 20 mg via INTRAVENOUS
  Administered 2024-01-16: 60 mg via INTRAVENOUS

## 2024-01-16 MED ORDER — PROPOFOL 10 MG/ML IV BOLUS
INTRAVENOUS | Status: AC
  Filled 2024-01-16: qty 20

## 2024-01-16 MED ORDER — ONDANSETRON HCL 4 MG/2ML IJ SOLN
INTRAMUSCULAR | Status: DC | PRN
  Administered 2024-01-16: 4 mg via INTRAVENOUS

## 2024-01-16 MED ORDER — GLYCOPYRROLATE 0.2 MG/ML IJ SOLN
INTRAMUSCULAR | Status: DC | PRN
  Administered 2024-01-16: .2 mg via INTRAVENOUS

## 2024-01-16 MED ORDER — ONDANSETRON HCL 4 MG/2ML IJ SOLN
INTRAMUSCULAR | Status: AC
  Filled 2024-01-16: qty 2

## 2024-01-16 MED ORDER — BUPIVACAINE HCL 0.25 % IJ SOLN
INTRAMUSCULAR | Status: DC | PRN
  Administered 2024-01-16: 30 mL

## 2024-01-16 MED ORDER — HYDROMORPHONE HCL 1 MG/ML IJ SOLN
0.2500 mg | INTRAMUSCULAR | Status: DC | PRN
  Administered 2024-01-16 (×4): 0.5 mg via INTRAVENOUS

## 2024-01-16 MED ORDER — DEXAMETHASONE SODIUM PHOSPHATE 10 MG/ML IJ SOLN: 4.0000 mg | INTRAMUSCULAR | Status: DC

## 2024-01-16 MED ORDER — LIDOCAINE HCL (PF) 2 % IJ SOLN
INTRAMUSCULAR | Status: DC | PRN
Start: 2024-01-16 — End: 2024-01-16
  Administered 2024-01-16: 50 mg via INTRADERMAL

## 2024-01-16 MED ORDER — MIDAZOLAM HCL 2 MG/2ML IJ SOLN
INTRAMUSCULAR | Status: DC | PRN
Start: 2024-01-16 — End: 2024-01-16
  Administered 2024-01-16: 2 mg via INTRAVENOUS

## 2024-01-16 MED ORDER — FENTANYL CITRATE (PF) 100 MCG/2ML IJ SOLN
INTRAMUSCULAR | Status: DC | PRN
  Administered 2024-01-16: 50 ug via INTRAVENOUS
  Administered 2024-01-16: 100 ug via INTRAVENOUS
  Administered 2024-01-16: 50 ug via INTRAVENOUS

## 2024-01-16 MED ORDER — KETOROLAC TROMETHAMINE 30 MG/ML IJ SOLN
INTRAMUSCULAR | Status: DC | PRN
  Administered 2024-01-16: 15 mg via INTRAVENOUS

## 2024-01-16 MED ORDER — SUGAMMADEX SODIUM 200 MG/2ML IV SOLN
INTRAVENOUS | Status: AC
  Filled 2024-01-16: qty 4

## 2024-01-16 MED ORDER — FENTANYL CITRATE (PF) 100 MCG/2ML IJ SOLN
INTRAMUSCULAR | Status: AC
  Filled 2024-01-16: qty 2

## 2024-01-16 MED ORDER — LACTATED RINGERS IR SOLN
Status: DC | PRN
Start: 2024-01-16 — End: 2024-01-16
  Administered 2024-01-16: 1000 mL

## 2024-01-16 MED ORDER — DROPERIDOL 2.5 MG/ML IJ SOLN: 0.6250 mg | Freq: Once | INTRAMUSCULAR | Status: DC | PRN

## 2024-01-16 MED ORDER — DEXAMETHASONE SODIUM PHOSPHATE 10 MG/ML IJ SOLN
INTRAMUSCULAR | Status: DC | PRN
  Administered 2024-01-16: 10 mg via INTRAVENOUS

## 2024-01-16 MED ORDER — CHLORHEXIDINE GLUCONATE 0.12 % MT SOLN
15.0000 mL | Freq: Once | OROMUCOSAL | Status: AC
  Administered 2024-01-16: 15 mL via OROMUCOSAL

## 2024-01-16 MED ORDER — ORAL CARE MOUTH RINSE: 15.0000 mL | Freq: Once | OROMUCOSAL | Status: AC

## 2024-01-16 MED ORDER — LACTATED RINGERS IV SOLN: INTRAVENOUS | Status: DC

## 2024-01-16 MED ORDER — ACETAMINOPHEN 10 MG/ML IV SOLN: 1000.0000 mg | Freq: Once | INTRAVENOUS | Status: DC | PRN

## 2024-01-16 MED ORDER — MIDAZOLAM HCL 2 MG/2ML IJ SOLN
INTRAMUSCULAR | Status: AC
  Filled 2024-01-16: qty 2

## 2024-01-16 MED ORDER — DEXTROSE 5 % IV SOLN
INTRAVENOUS | Status: DC | PRN
  Administered 2024-01-16: 2 g via INTRAVENOUS

## 2024-01-16 MED ORDER — ROCURONIUM BROMIDE 10 MG/ML (PF) SYRINGE
PREFILLED_SYRINGE | INTRAVENOUS | Status: AC
  Filled 2024-01-16: qty 10

## 2024-01-16 MED ORDER — BUPIVACAINE-EPINEPHRINE (PF) 0.25% -1:200000 IJ SOLN
INTRAMUSCULAR | Status: AC
  Filled 2024-01-16: qty 30

## 2024-01-16 MED ORDER — PHENYLEPHRINE 80 MCG/ML (10ML) SYRINGE FOR IV PUSH (FOR BLOOD PRESSURE SUPPORT)
PREFILLED_SYRINGE | INTRAVENOUS | Status: AC
  Filled 2024-01-16: qty 10

## 2024-01-16 MED ORDER — HYDROMORPHONE HCL 1 MG/ML IJ SOLN
INTRAMUSCULAR | Status: AC
  Filled 2024-01-16: qty 1

## 2024-01-16 MED ORDER — ACETAMINOPHEN 500 MG PO TABS
1000.0000 mg | ORAL_TABLET | ORAL | Status: AC
  Administered 2024-01-16: 1000 mg via ORAL
  Filled 2024-01-16: qty 2

## 2024-01-16 MED ORDER — PHENYLEPHRINE HCL (PRESSORS) 10 MG/ML IV SOLN
INTRAVENOUS | Status: AC
  Filled 2024-01-16: qty 1

## 2024-01-16 MED ORDER — OXYCODONE HCL 5 MG/5ML PO SOLN: 5.0000 mg | Freq: Once | ORAL | Status: DC | PRN

## 2024-01-16 MED ORDER — KETOROLAC TROMETHAMINE 30 MG/ML IJ SOLN
INTRAMUSCULAR | Status: AC
  Filled 2024-01-16: qty 1

## 2024-01-16 MED ORDER — PHENYLEPHRINE HCL-NACL 20-0.9 MG/250ML-% IV SOLN
INTRAVENOUS | Status: DC | PRN
Start: 2024-01-16 — End: 2024-01-16
  Administered 2024-01-16: 15 ug/min via INTRAVENOUS

## 2024-01-16 MED ORDER — HEPARIN SODIUM (PORCINE) 5000 UNIT/ML IJ SOLN
5000.0000 [IU] | INTRAMUSCULAR | Status: AC
  Administered 2024-01-16: 5000 [IU] via SUBCUTANEOUS
  Filled 2024-01-16: qty 1

## 2024-01-16 MED ORDER — BUPIVACAINE HCL (PF) 0.25 % IJ SOLN
INTRAMUSCULAR | Status: AC
Start: 2024-01-16 — End: 2024-01-16
  Filled 2024-01-16: qty 30

## 2024-01-16 MED ORDER — OXYCODONE HCL 5 MG PO TABS: 5.0000 mg | ORAL_TABLET | Freq: Once | ORAL | Status: DC | PRN

## 2024-01-16 MED ORDER — SUGAMMADEX SODIUM 200 MG/2ML IV SOLN
INTRAVENOUS | Status: DC | PRN
Start: 2024-01-16 — End: 2024-01-16
  Administered 2024-01-16: 200 mg via INTRAVENOUS

## 2024-01-16 MED ORDER — PROPOFOL 10 MG/ML IV BOLUS
INTRAVENOUS | Status: DC | PRN
Start: 2024-01-16 — End: 2024-01-16
  Administered 2024-01-16: 50 mg via INTRAVENOUS
  Administered 2024-01-16: 150 mg via INTRAVENOUS

## 2024-01-16 SURGICAL SUPPLY — 71 items
APPLICATOR SURGIFLO ENDO (HEMOSTASIS) IMPLANT
BAG COUNTER SPONGE SURGICOUNT (BAG) IMPLANT
BAG LAPAROSCOPIC 12 15 PORT 16 (BASKET) IMPLANT
BLADE SURG SZ10 CARB STEEL (BLADE) IMPLANT
COVER BACK TABLE 60X90IN (DRAPES) ×2 IMPLANT
COVER TIP SHEARS 8 DVNC (MISCELLANEOUS) ×2 IMPLANT
DERMABOND ADVANCED .7 DNX12 (GAUZE/BANDAGES/DRESSINGS) ×2 IMPLANT
DRAPE ARM DVNC X/XI (DISPOSABLE) ×8 IMPLANT
DRAPE COLUMN DVNC XI (DISPOSABLE) ×2 IMPLANT
DRAPE SHEET LG 3/4 BI-LAMINATE (DRAPES) ×2 IMPLANT
DRAPE SURG IRRIG POUCH 19X23 (DRAPES) ×2 IMPLANT
DRIVER NDL MEGA SUTCUT DVNCXI (INSTRUMENTS) ×2 IMPLANT
DRIVER NDLE MEGA SUTCUT DVNCXI (INSTRUMENTS) IMPLANT
DRSG OPSITE POSTOP 4X6 (GAUZE/BANDAGES/DRESSINGS) IMPLANT
DRSG OPSITE POSTOP 4X8 (GAUZE/BANDAGES/DRESSINGS) IMPLANT
ELECT PENCIL ROCKER SW 15FT (MISCELLANEOUS) IMPLANT
ELECT REM PT RETURN 15FT ADLT (MISCELLANEOUS) ×2 IMPLANT
FORCEPS BPLR FENES DVNC XI (FORCEP) ×2 IMPLANT
FORCEPS PROGRASP DVNC XI (FORCEP) ×2 IMPLANT
GAUZE 4X4 16PLY ~~LOC~~+RFID DBL (SPONGE) ×4 IMPLANT
GLOVE BIO SURGEON STRL SZ 6 (GLOVE) ×8 IMPLANT
GLOVE BIO SURGEON STRL SZ 6.5 (GLOVE) ×2 IMPLANT
GOWN STRL REUS W/ TWL LRG LVL3 (GOWN DISPOSABLE) ×8 IMPLANT
GRASPER SUT TROCAR 14GX15 (MISCELLANEOUS) IMPLANT
HOLDER FOLEY CATH W/STRAP (MISCELLANEOUS) IMPLANT
IRRIGATION SUCT STRKRFLW 2 WTP (MISCELLANEOUS) ×2 IMPLANT
KIT PROCEDURE DVNC SI (MISCELLANEOUS) IMPLANT
KIT TURNOVER KIT A (KITS) ×2 IMPLANT
LIGASURE IMPACT 36 18CM CVD LR (INSTRUMENTS) IMPLANT
MANIPULATOR ADVINCU DEL 3.0 PL (MISCELLANEOUS) IMPLANT
MANIPULATOR ADVINCU DEL 3.5 PL (MISCELLANEOUS) IMPLANT
MANIPULATOR UTERINE 4.5 ZUMI (MISCELLANEOUS) IMPLANT
NDL HYPO 21X1.5 SAFETY (NEEDLE) ×2 IMPLANT
NDL SPNL 18GX3.5 QUINCKE PK (NEEDLE) IMPLANT
NEEDLE HYPO 21X1.5 SAFETY (NEEDLE) ×2 IMPLANT
NEEDLE SPNL 18GX3.5 QUINCKE PK (NEEDLE) IMPLANT
OBTURATOR OPTICALSTD 8 DVNC (TROCAR) ×2 IMPLANT
PACK ROBOT GYN CUSTOM WL (TRAY / TRAY PROCEDURE) ×2 IMPLANT
PAD POSITIONING PINK XL (MISCELLANEOUS) ×2 IMPLANT
PORT ACCESS TROCAR AIRSEAL 12 (TROCAR) IMPLANT
RELOAD STAPLE 45 2.6 WHT THIN (STAPLE) IMPLANT
SCISSORS LAP 5X45 EPIX DISP (ENDOMECHANICALS) IMPLANT
SCISSORS MNPLR CVD DVNC XI (INSTRUMENTS) ×2 IMPLANT
SCRUB CHG 4% DYNA-HEX 4OZ (MISCELLANEOUS) IMPLANT
SEAL UNIV 5-12 XI (MISCELLANEOUS) ×8 IMPLANT
SET TRI-LUMEN FLTR TB AIRSEAL (TUBING) ×2 IMPLANT
SPIKE FLUID TRANSFER (MISCELLANEOUS) ×2 IMPLANT
SPONGE T-LAP 18X18 ~~LOC~~+RFID (SPONGE) IMPLANT
STAPLER POWER ECHELON 45 WIDE (STAPLE) IMPLANT
SURGIFLO W/THROMBIN 8M KIT (HEMOSTASIS) IMPLANT
SUT MNCRL AB 4-0 PS2 18 (SUTURE) IMPLANT
SUT PDS AB 1 TP1 96 (SUTURE) IMPLANT
SUT STRATA PDS 0 30 CT-2.5 (SUTURE) IMPLANT
SUT V-LOC 180 0-0 GS22 (SUTURE) IMPLANT
SUT VIC AB 0 CT1 27XBRD ANTBC (SUTURE) IMPLANT
SUT VIC AB 2-0 CT1 TAPERPNT 27 (SUTURE) IMPLANT
SUT VIC AB 2-0 SH 27X BRD (SUTURE) IMPLANT
SUT VIC AB 4-0 PS2 18 (SUTURE) ×4 IMPLANT
SUT VICRYL 0 27 CT2 27 ABS (SUTURE) ×2 IMPLANT
SUT VLOC 180 0 9IN GS21 (SUTURE) IMPLANT
SYR 10ML LL (SYRINGE) IMPLANT
SYSTEM BAG RETRIEVAL 10MM (BASKET) IMPLANT
SYSTEM RETRIEVL 5MM INZII UNIV (BASKET) IMPLANT
SYSTEM WOUND ALEXIS 18CM MED (MISCELLANEOUS) IMPLANT
TRAP SPECIMEN MUCUS 40CC (MISCELLANEOUS) IMPLANT
TRAY FOLEY MTR SLVR 16FR STAT (SET/KITS/TRAYS/PACK) ×2 IMPLANT
TROCAR PORT AIRSEAL 5X120 (TROCAR) IMPLANT
TROCAR XCEL NON-BLD 5MMX100MML (ENDOMECHANICALS) ×2 IMPLANT
UNDERPAD 30X36 HEAVY ABSORB (UNDERPADS AND DIAPERS) ×4 IMPLANT
WATER STERILE IRR 1000ML POUR (IV SOLUTION) ×2 IMPLANT
YANKAUER SUCT BULB TIP 10FT TU (MISCELLANEOUS) IMPLANT

## 2024-01-16 NOTE — Anesthesia Postprocedure Evaluation (Signed)
 Anesthesia Post Note  Patient: Tracy Frye  Procedure(s) Performed: SALPINGO-OOPHORECTOMY, BILATERAL, ROBOT-ASSISTED, (Bilateral) LYSIS, ADHESIONS, ROBOT-ASSISTED, LAPAROSCOPIC APPENDECTOMY, ROBOT-ASSISTED, LAPAROSCOPIC     Patient location during evaluation: PACU Anesthesia Type: General Level of consciousness: awake and alert Pain management: pain level controlled Vital Signs Assessment: post-procedure vital signs reviewed and stable Respiratory status: spontaneous breathing, nonlabored ventilation, respiratory function stable and patient connected to nasal cannula oxygen Cardiovascular status: blood pressure returned to baseline and stable Postop Assessment: no apparent nausea or vomiting Anesthetic complications: no   No notable events documented.  Last Vitals:  Vitals:   01/16/24 1215 01/16/24 1225  BP: 109/70 116/69  Pulse: 83 79  Resp: 18 16  Temp:  36.4 C  SpO2: 93% 94%    Last Pain:  Vitals:   01/16/24 1225  TempSrc:   PainSc: 3                  Franky JONETTA Bald

## 2024-01-16 NOTE — Op Note (Signed)
 OPERATIVE NOTE  Pre-operative Diagnosis: Adnexal mass  Post-operative Diagnosis: same, pelvic adhesions, appendiceal stump  Operation: Robotic-assisted laparoscopic lysis of adhesions and enterolysis (for approximately 30 minutes), bilateral salpingo-oophorectomy, excision of appendiceal stump   Surgeon: Viktoria Crank MD  Assistant Surgeon: Eleanor Epps, NP (an NP assistant was necessary for tissue manipulation, management of robotic instrumentation, retraction and positioning due to the complexity of the case and hospital policies).   Anesthesia: GET  Urine Output: 400 cc  Operative Findings: On EUA, mobile mass appreciated in the right pelvis. On intra-abdominal entry, normal upper abdominal survey. Some adhesions of the omentum to the anterior abdominal wall below the level of the umbilicus. Cecum adherent to the pelvic brim on the right. 1 cm mildly dilated and firm appendiceal stump noted. Minimal ascites (yellow tinged). Adhesions of the sigmoid epiploica to the left pelvic sidewall and to the vaginal cuff. Right ovary 5-6 cm and cystic in appearance. Left ovary 2 cm and somewhat retroperitonealized. Normal appearing bilateral fallopian tubes. No peritoneal disease.  On frozen, benign cystic lesion grossly with nothing to freeze.  Estimated Blood Loss:  25 cc      Total IV Fluids: see I&O flowsheet         Specimens: bilateral tubes and ovaries, pelvic washings, appendiceal stump         Complications:  None apparent; patient tolerated the procedure well.         Disposition: PACU - hemodynamically stable.  Procedure Details  The patient was seen in the Holding Room. The risks, benefits, complications, treatment options, and expected outcomes were discussed with the patient.  The patient concurred with the proposed plan, giving informed consent.  The site of surgery properly noted/marked. The patient was identified as Tracy Frye and the procedure verified as a  Robotic-assisted bilateral salpingo-oophorectomy with any other indicated procedures.   After induction of anesthesia, the patient was draped and prepped in the usual sterile manner. Patient was placed in supine position after anesthesia and draped and prepped in the usual sterile manner as follows: Her arms were tucked to her side with all appropriate precautions.  The patient was secured to the bed with padding and tape across her chest.  The patient was placed in the semi-lithotomy position in Royer stirrups.  The perineum and vagina were prepped with CHG. The patient's abdomen was prepped with ChloraPrep and then she was draped after the prep had been allowed to dry for 3 minutes.  A Time Out was held and the above information confirmed.  The urethra was prepped with Betadine. Foley catheter was placed. OG tube placement was confirmed and to suction.   Next, a 10 mm skin incision was made 1 cm below the subcostal margin in the midclavicular line.  The 5 mm Optiview port and scope was used for direct entry.  Opening pressure was under 10 mm CO2.  The abdomen was insufflated and the findings were noted as above.   At this point and all points during the procedure, the patient's intra-abdominal pressure did not exceed 15 mmHg. Next, an 8 mm skin incision was made superior to the umbilicus and a right and left port were placed about 8 cm lateral to the robot port on the right and left side.  The 5 mm assist trocar was exchanged for a 12 airseal mm port. All ports were placed under direct visualization.  The patient was placed in steep Trendelenburg.  The robot was docked in the normal manner.  Attention  was first turned to lysis of omental adhesions to the anterior abdominal wall just below the umbilicus.  This was done using blunt dissection, sharp dissection, and short burst of monopolar electrocautery after the transverse colon had been identified.  Once the omentum was completely freed, attention was  turned to the right.  The cecum was somewhat adherent at the right pelvic brim.  Using sharp dissection, the cecum was mobilized above the level of the pelvic brim.  The right retroperitoneum was then opened parallel to the IP ligament to open the retroperitoneal space.  The ureter was noted to be on the medial leaf of the broad ligament.  The peritoneum above the ureter was incised and the IP ligament was skeletonized, cauterized, and cut.  With upward traction on the right adnexa, the remaining attachments to the right peritoneal sidewall were lysed using monopolar electrocautery.  The right adnexa was then placed in an Endo Catch bag and removed through the assist trocar after contained cyst decompression.  The right adnexa was then sent to pathology for frozen section.  Attention was then turned to the left.  Using sharp dissection, the omental adhesions to the left pelvic sidewall were lysed sharply.  Sharp dissection was also used to lyse lesions between the sigmoid epiploica and the left sidewall and vaginal cuff until the sigmoid colon was completely freed.  The left peritoneum was then opened parallel to the IP ligament and the retroperitoneal space dissected.  The ureter was noted to be along the medial leaf of the broad ligament.  The peritoneum above the ureter was incised and the infundibulopelvic ligament was skeletonized, cauterized, and transected.  With upward traction on the left adnexa, the remaining attachments of the adnexa to the left sidewall peritoneum were lysed with monopolar electrocautery.  The left adnexa was then placed in an Endo Catch bag and removed through the assist trocar.  Pelvis was irrigated with excellent hemostasis noted.  Attention was then turned to the abdomen.  There appeared to be a 1-2 cm remnant of the appendix which was somewhat firm.  At this point, I asked Dr. Curvin with general surgery for assistance.  He agreed that appendiceal stump was somewhat longer  than expected and agreed that proceeding with removal of the appendiceal stump was prudent.  The appendiceal stump was mobilized and isolated from the adjacent cecum.  GIA 45 mm laparoscopic stapler was then brought in and used to staple and transect the appendix at its base.  The appendiceal stump was placed in a 5 mm Endo Catch bag and removed through the assist trocar.  Excellent hemostasis noted after irrigation.  2 g of cefoxitin  was given.  At this point in the procedure was completed.  Robotic instruments were removed under direct visulaization.  The robot was undocked. The fascia at the 12 mm port was closed with 0 Vicryl using a PMI fascial closure device.  The subcuticular tissue was closed with 4-0 Vicryl and the skin was closed with 4-0 Monocryl in a subcuticular manner.  Dermabond was applied.    Foley catheter was removed.  All sponge, lap and needle counts were correct x  3.   The patient was transferred to the recovery room in stable condition.  Comer Dollar, MD

## 2024-01-16 NOTE — H&P (Signed)
 Gynecologic Oncology H&P  01/16/24  Treatment History: Tracy Frye is a 68 y.o. y.o. female who is seen in consultation at the request of Prentice Batch, FNP for an evaluation of an adnexal mass.   Patient has a history of back pain, has previously received steroid injections for this.  Last injection provided relief for less time than his and typical.  MRI was performed in June with finding of moderate left hydroureter, increased from previously.  CT was recommended to evaluate for urolithiasis.   CT of the abdomen and pelvis done for abdominal and flank pain on 11/20/2023 revealed a multiseptated right ovarian cyst measuring 5.3 x 3.7 x 4.6 cm.  Sigmoid diverticulosis noted no acute diverticulitis.  Developing, staghorn type calculus in the lower pole of the left kidney measuring 11 mm.  CA-125: 3.6 CEA: <1  12/05/23: Pelvic ultrasound Complex cystic lesion in the right adnexa by ultrasound measures 4.0 x 3.5 x 3.8 cm. This measures slightly smaller than the prior CT scan but this could be technical. It is difficult to separate this from normal ovarian tissue in the structure very well could be ovarian based on the prior CT scan. A complex or aggressive lesion is in the differential. Recommend either continued short follow-up ultrasound in 12 weeks versus further workup with MRI with and without contrast. Absent uterus. Left ovary not seen but was quite small on the prior CT. Overall limited study with a poor sonographic window.  12/17/23: MRI pelvis 1. Complex, multiseptated cystic lesion of the right ovary measuring 5.9 x 3.6 x 4.5 cm. Somewhat thickened, perceptibly enhancing internal septations throughout. 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 2. No evidence of lymphadenopathy or metastatic disease in the pelvis. 3. Normal postmenopausal appearance of the left ovary. 4. Hysterectomy.  Interval History: Doing well.  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   WISDOM TOOTH EXTRACTION      Family History  Problem Relation Age of Onset   Pancreatic cancer Half-Brother    Breast cancer Neg Hx    Prostate cancer Neg Hx    Colon cancer Neg Hx    Ovarian cancer Neg Hx    Endometrial cancer Neg Hx     Social History   Socioeconomic History   Marital status: Married    Spouse name: Not on file   Number of children: Not on file   Years of education: Not on file   Highest education level: Not on file  Occupational History   Occupation: retired  Tobacco Use   Smoking status: Never   Smokeless tobacco: Never  Vaping Use  Vaping status: Never Used  Substance and Sexual Activity   Alcohol use: No   Drug use: Never   Sexual activity: Not Currently  Other Topics Concern   Not on file  Social History Narrative   Not on file   Social Drivers of Health   Financial Resource Strain: Not on file  Food Insecurity: Not on file  Transportation Needs: Not on file  Physical Activity: Not on file  Stress: Not on file  Social Connections: Not on file    Current Medications:  Current Facility-Administered Medications:    dexamethasone  (DECADRON ) injection 4 mg, 4 mg, Intravenous, On Call to OR, Cross, Eleanor BIRCH, NP   lactated ringers   infusion, , Intravenous, Continuous, Cleotilde Butler Dade, MD, Last Rate: 10 mL/hr at 01/16/24 0546, New Bag at 01/16/24 0546  Review of Systems: + Blood in urine, joint pain, headache Denies appetite changes, fevers, chills, fatigue, unexplained weight changes. Denies hearing loss, neck lumps or masses, mouth sores, ringing in ears or voice changes. Denies cough or wheezing.  Denies shortness of breath. Denies chest pain or palpitations. Denies leg swelling. Denies abdominal distention, pain, blood in stools, constipation, diarrhea, nausea, vomiting, or early satiety. Denies pain with intercourse, dysuria, frequency or incontinence. Denies hot flashes, pelvic pain, vaginal bleeding or vaginal discharge.   Denies back pain or muscle pain/cramps. Denies itching, rash, or wounds. Denies dizziness, numbness or seizures. Denies swollen lymph nodes or glands, denies easy bruising or bleeding. Denies anxiety, depression, confusion, or decreased concentration.  Physical Exam: BP 121/75   Pulse (!) 57   Temp 98.3 F (36.8 C) (Oral)   Resp 16   Ht 5' 7.5 (1.715 m)   Wt 185 lb (83.9 kg)   SpO2 93%   BMI 28.55 kg/m  General: Alert, oriented, no acute distress.  HEENT: Normocephalic, atraumatic. Sclera anicteric.  Chest: Clear to auscultation bilaterally. No wheezes, rhonchi, or rales. Cardiovascular: Regular rate and rhythm, no murmurs, rubs, or gallops.  Abdomen: Normoactive bowel sounds. Soft, nondistended, nontender to palpation. No masses or hepatosplenomegaly appreciated. No palpable fluid wave.  Well-healed low transverse incision. Extremities: Grossly normal range of motion. Warm, well perfused. No edema bilaterally.   Laboratory & Radiologic Studies:    Latest Ref Rng & Units 01/13/2024    9:46 AM  CBC  WBC 4.0 - 10.5 K/uL 6.0   Hemoglobin 12.0 - 15.0 g/dL 87.4   Hematocrit 63.9 - 46.0 % 40.6   Platelets 150 - 400 K/uL 255       Latest Ref Rng & Units 01/13/2024    9:46 AM   BMP  Glucose 70 - 99 mg/dL 96   BUN 8 - 23 mg/dL 10   Creatinine 9.55 - 1.00 mg/dL 9.22   Sodium 864 - 854 mmol/L 136   Potassium 3.5 - 5.1 mmol/L 4.2   Chloride 98 - 111 mmol/L 104   CO2 22 - 32 mmol/L 23   Calcium 8.9 - 10.3 mg/dL 9.0    Assessment & Plan: Tracy Frye is a 68 y.o. woman with a complex adnexal mass, normal tumor markers.  Plan for definitive surgery today with robotic BSO, possible staging. The risks of surgery were discussed in detail and she understands these to include infection; wound separation; hernia; injury to adjacent organs such as bowel, bladder, blood vessels, ureters and nerves; bleeding which may require blood transfusion; anesthesia risk; thromboembolic events; possible death; unforeseen complications; possible need for re-exploration; medical complications such as heart attack, stroke, pleural  effusion and pneumonia; and, if full lymphadenectomy is performed the risk of lymphedema and lymphocyst. The patient will receive DVT and antibiotic prophylaxis as indicated. She voiced a clear understanding. She had the opportunity to ask questions.   Comer Dollar, MD  Division of Gynecologic Oncology  Department of Obstetrics and Gynecology  [facility]

## 2024-01-16 NOTE — Anesthesia Procedure Notes (Signed)
 Procedure Name: Intubation Date/Time: 01/16/2024 7:48 AM  Performed by: Obadiah Reyes BROCKS, CRNAPre-anesthesia Checklist: Patient identified, Emergency Drugs available, Suction available and Patient being monitored Patient Re-evaluated:Patient Re-evaluated prior to induction Oxygen Delivery Method: Circle System Utilized Preoxygenation: Pre-oxygenation with 100% oxygen Induction Type: IV induction Ventilation: Mask ventilation without difficulty Laryngoscope Size: 2, Glidescope and 3 Grade View: Grade III Tube type: Oral Number of attempts: 1 Airway Equipment and Method: Stylet and Oral airway Placement Confirmation: ETT inserted through vocal cords under direct vision, positive ETCO2 and breath sounds checked- equal and bilateral Secured at: 20 cm Tube secured with: Tape Dental Injury: Teeth and Oropharynx as per pre-operative assessment

## 2024-01-16 NOTE — Discharge Instructions (Addendum)
 AFTER SURGERY INSTRUCTIONS   Return to work: 4-6 weeks if applicable  Today Dr. Viktoria removed both ovaries and fallopian tubes. When the pathologist looked at the right ovarian cyst, he felt this appeared simple and not concerning. We will wait for the final path to confirm this. We also took a piece of your appendix that was remaining after your prior appendectomy since it appeared firm and enlarged. They will look at this under the microscope as well.    Activity: 1. Be up and out of the bed during the day.  Take a nap if needed.  You may walk up steps but be careful and use the hand rail.  Stair climbing will tire you more than you think, you may need to stop part way and rest.    2. No lifting or straining for 6 weeks over 10 pounds. No pushing, pulling, straining for 6 weeks.   3. No driving for 4-89 days when the following criteria have been met: Do not drive if you are taking narcotic pain medicine and make sure that your reaction time has returned.    4. You can shower as soon as the next day after surgery. Shower daily.  Use your regular soap and water  (not directly on the incision) and pat your incision(s) dry afterwards; don't rub.  No tub baths or submerging your body in water  until cleared by your surgeon. If you have the soap that was given to you by pre-surgical testing that was used before surgery, you do not need to use it afterwards because this can irritate your incisions.    5. No sexual activity and nothing in the vagina for 4-6 weeks.   6. You may experience a small amount of clear drainage from your incisions, which is normal.  If the drainage persists, increases, or changes color please call the office.   7. Do not use creams, lotions, or ointments such as neosporin on your incisions after surgery until advised by your surgeon because they can cause removal of the dermabond glue on your incisions.     8. Take Tylenol  or ibuprofen first for pain if you are able to take  these medications and only use narcotic pain medication for severe pain not relieved by the Tylenol  or Ibuprofen.  Monitor your Tylenol  intake to a max of 4,000 mg in a 24 hour period. You can alternate these medications after surgery.   Diet: 1. Low sodium Heart Healthy Diet is recommended but you are cleared to resume your normal (before surgery) diet after your procedure.   2. It is safe to use a laxative, such as Miralax or Colace, if you have difficulty moving your bowels before surgery. You have been prescribed Sennakot-S to take at bedtime every evening after surgery to keep bowel movements regular and to prevent constipation.     Wound Care: 1. Keep clean and dry.  Shower daily.   Reasons to call the Doctor: Fever - Oral temperature greater than 100.4 degrees Fahrenheit Foul-smelling vaginal discharge Difficulty urinating Nausea and vomiting Increased pain at the site of the incision that is unrelieved with pain medicine. Difficulty breathing with or without chest pain New calf pain especially if only on one side Sudden, continuing increased vaginal bleeding with or without clots.   Contacts: For questions or concerns you should contact:   Dr. Comer Viktoria at [phone]   Eleanor Epps, NP at [phone]   After Hours: call [phone] and have the GYN Oncologist paged/contacted (after 5  pm or on the weekends). You will speak with an after hours RN and let he or she know you have had surgery.

## 2024-01-16 NOTE — Transfer of Care (Signed)
 Immediate Anesthesia Transfer of Care Note  Patient: Tracy Frye [facility]  Procedure(s) Performed: SALPINGO-OOPHORECTOMY, BILATERAL, ROBOT-ASSISTED, (Bilateral) LYSIS, ADHESIONS, ROBOT-ASSISTED, LAPAROSCOPIC APPENDECTOMY, ROBOT-ASSISTED, LAPAROSCOPIC  Patient Location: PACU  Anesthesia Type:General  Level of Consciousness: awake, alert , sedated, and responds to stimulation  Airway & Oxygen Therapy: Patient Spontanous Breathing and Patient connected to nasal cannula oxygen  Post-op Assessment: Report given to RN and Post -op Vital signs reviewed and stable  Post vital signs: Reviewed and stable  Last Vitals:  Vitals Value Taken Time  BP 150/99 01/16/24 09:53  Temp    Pulse 85 01/16/24 09:58  Resp 20 01/16/24 09:58  SpO2 96 % 01/16/24 09:58  Vitals shown include unfiled device data.  Last Pain:  Vitals:   01/16/24 0539  TempSrc:   PainSc: 0-No pain         Complications: No notable events documented.

## 2024-01-16 NOTE — Brief Op Note (Signed)
 01/16/2024  9:49 AM  PATIENT:  Tracy Frye  68 y.o. female  PRE-OPERATIVE DIAGNOSIS:  Adnexal mass  POST-OPERATIVE DIAGNOSIS:  Adnexal mass  PROCEDURE:  Procedure(s): SALPINGO-OOPHORECTOMY, BILATERAL, ROBOT-ASSISTED, (Bilateral) LYSIS, ADHESIONS, ROBOT-ASSISTED, LAPAROSCOPIC APPENDECTOMY, ROBOT-ASSISTED, LAPAROSCOPIC  SURGEON:  Surgeons and Role:    DEWAINE Viktoria Comer JONELLE, MD - Primary  ASSISTANTS: Eleanor Epps, NP   ANESTHESIA:   general  EBL:  25 mL   BLOOD ADMINISTERED:none  DRAINS: none   LOCAL MEDICATIONS USED:  0.25% marcaine   SPECIMEN:  washings, bilateral tubes and ovaries, appendix stump  DISPOSITION OF SPECIMEN:  PATHOLOGY  COUNTS:  YES  TOURNIQUET:  * No tourniquets in log *  DICTATION: .Note written in EPIC  PLAN OF CARE: Discharge to home after PACU  PATIENT DISPOSITION:  PACU - hemodynamically stable.   Delay start of Pharmacological VTE agent (>24hrs) due to surgical blood loss or risk of bleeding: not applicable

## 2024-01-17 ENCOUNTER — Encounter (HOSPITAL_COMMUNITY): Payer: Self-pay | Admitting: Gynecologic Oncology

## 2024-01-17 ENCOUNTER — Telehealth: Payer: Self-pay | Admitting: *Deleted

## 2024-01-17 NOTE — Telephone Encounter (Signed)
 Spoke with Tracy Frye this morning. She states she is eating, drinking and urinating well. She has not had a BM yet and is not  passing gas yet? Encouraged ambulation, hot liquids, over the counter gas-x chews after meals.  She is taking senokot as prescribed and encouraged her to drink plenty of water . She denies fever or chills. Incisions are dry and intact. She rates her pain 6/10. Her pain is controlled with advil during the day and states she took 1 tramadol  last night.  Pt reports having a irritated sore throat. Encouraged warm salt water  gargles. This is from being intubated while in the OR and should resolve.   Instructed to call office with any fever, chills, purulent drainage, uncontrolled pain or any other questions or concerns. Patient verbalizes understanding.   Pt aware of post op appointments as well as the office number [phone] and after hours number [phone] to call if she has any questions or concerns

## 2024-01-21 ENCOUNTER — Ambulatory Visit: Payer: Self-pay | Admitting: Gynecologic Oncology

## 2024-01-21 ENCOUNTER — Telehealth: Payer: Self-pay | Admitting: *Deleted

## 2024-01-21 LAB — SURGICAL PATHOLOGY

## 2024-01-21 NOTE — Telephone Encounter (Signed)
 Per request from Jacinto at [facility] fax office notes and op notes to office

## 2024-01-22 LAB — CYTOLOGY - NON PAP

## 2024-01-23 ENCOUNTER — Inpatient Hospital Stay: Attending: Gynecologic Oncology | Admitting: Gynecologic Oncology

## 2024-01-23 ENCOUNTER — Encounter: Payer: Self-pay | Admitting: Gynecologic Oncology

## 2024-01-23 DIAGNOSIS — Z9079 Acquired absence of other genital organ(s): Secondary | ICD-10-CM

## 2024-01-23 DIAGNOSIS — Z90722 Acquired absence of ovaries, bilateral: Secondary | ICD-10-CM

## 2024-01-23 DIAGNOSIS — N83201 Unspecified ovarian cyst, right side: Secondary | ICD-10-CM

## 2024-01-23 NOTE — Progress Notes (Signed)
 Gynecologic Oncology Telehealth Note: Gyn-Onc  I connected with Tracy Frye on 01/23/24 at  6:00 PM EDT by telephone and verified that I am speaking with the correct person using two identifiers.  I discussed the limitations, risks, security and privacy concerns of performing an evaluation and management service by telemedicine and the availability of in-person appointments. I also discussed with the patient that there may be a patient responsible charge related to this service. The patient expressed understanding and agreed to proceed.  Other persons participating in the visit and their role in the encounter: none.  Patient's location: home Provider's location: [facility], [place]  Reason for Visit: follow-up  Treatment History: 01/16/24: Robotic-assisted laparoscopic lysis of adhesions and enterolysis (for approximately 30 minutes), bilateral salpingo-oophorectomy, excision of appendiceal stump   Interval History: Doing well. Denies significant pain. Some itching around incisions. Constipation improving.  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   REFRACTIVE SURGERY Bilateral 2004   ROBOTIC ASSISTED BILATERAL SALPINGO OOPHERECTOMY Bilateral 01/16/2024   Procedure: SALPINGO-OOPHORECTOMY, BILATERAL, ROBOT-ASSISTED,;  Surgeon: Viktoria Comer SAUNDERS, MD;  Location: [facility];  Service: Gynecology;  Laterality: Bilateral;   ROBOTIC ASSISTED LAPAROSCOPIC LYSIS OF ADHESION  01/16/2024   Procedure:  LYSIS, ADHESIONS, ROBOT-ASSISTED, LAPAROSCOPIC;  Surgeon: Viktoria Comer SAUNDERS, MD;  Location: [facility];  Service: Gynecology;;   TONSILECTOMY, ADENOIDECTOMY, BILATERAL MYRINGOTOMY AND TUBES     VAGINAL HYSTERECTOMY     Patient thinks this was done in 90's.   WISDOM TOOTH EXTRACTION     XI ROBOTIC LAPAROSCOPIC ASSISTED APPENDECTOMY  01/16/2024   Procedure: APPENDECTOMY, ROBOT-ASSISTED, LAPAROSCOPIC;  Surgeon: Viktoria Comer SAUNDERS, MD;  Location: [facility];  Service: Gynecology;;    Family History  Problem Relation Age of Onset   Pancreatic cancer Half-Brother    Breast cancer Neg Hx    Prostate cancer Neg Hx    Colon cancer Neg Hx    Ovarian cancer Neg Hx    Endometrial cancer Neg Hx     Social History   Socioeconomic History   Marital status: Married    Spouse name: Not on file   Number of children: Not on file   Years of education: Not on file   Highest education level: Not on file  Occupational History   Occupation: retired  Tobacco Use   Smoking status: Never   Smokeless tobacco: Never  Vaping Use   Vaping status: Never Used  Substance and Sexual Activity   Alcohol use: No   Drug use: Never   Sexual activity: Not Currently  Other Topics Concern   Not on file  Social History Narrative   Not on file   Social Drivers of Health   Financial Resource Strain: Not on file  Food Insecurity: Not on file  Transportation Needs: Not on file  Physical Activity: Not on file  Stress: Not on  file  Social Connections: Not on file    Current Medications:  Current Outpatient Medications:    alendronate (FOSAMAX) 70 MG tablet, Take 70 mg by mouth once a week. Take with a full glass of water  on an empty stomach., Disp: , Rfl:    Cholecalciferol (VITAMIN D) 50 MCG (2000 UT) tablet, Take 4,000 Units by mouth daily., Disp: , Rfl:    cholestyramine (QUESTRAN) 4 g packet, Take 4 g by mouth daily., Disp: , Rfl: 9   MAGNESIUM GLYCINATE PO, Take 420 mg by mouth at bedtime., Disp: , Rfl:     Multiple Vitamin (MULTIVITAMIN+) LIQD, Take 15 mLs by mouth daily. [employer] Women's Multivitamin, Disp: , Rfl:    senna-docusate (SENOKOT-S) 8.6-50 MG tablet, Take 2 tablets by mouth at bedtime. For AFTER surgery, do not take if having diarrhea, Disp: 30 tablet, Rfl: 0   traMADol  (ULTRAM ) 50 MG tablet, Take 1 tablet (50 mg total) by mouth every 6 (six) hours as needed. For AFTER surgery only, do not take and drive, Disp: 10 tablet, Rfl: 0  Review of Symptoms: Pertinent positives as per HPI.  Physical Exam: Deferred given limitations of phone visit.  Laboratory & Radiologic Studies: A. FALLOPIAN TUBE, OVARY, RIGHT, SALPINGO OOPHORECTOMY:  - Ovarian seromucinous cystadenoma.  - Fallopian tube with fimbriated end.  - Negative for malignancy.   B. FALLOPIAN TUBE, OVARY, LEFT, SALPINGO OOPHORECTOMY:  - Ovary with cortical inclusion cysts.  - Fallopian tube with fimbriated end.  - Negative for malignancy.   C. APPENDICEAL STUMP, APPENDECTOMY:  - Appendiceal stump, status post appendectomy.  - Negative for malignancy.   FINAL MICROSCOPIC DIAGNOSIS:  - No malignant cells identified   Assessment & Plan: Tracy Frye is a 68 y.o. woman s/p robotic BSO, lysis of adhesions and excision of appendiceal stump. Benign final pathology.   Doing well. Discussed continued expectations and restrictions. Reviewed beng pathology. Questions answered.  I discussed the assessment and treatment plan with the patient. The patient was provided with an opportunity to ask questions and all were answered. The patient agreed with the plan and demonstrated an understanding of the instructions.   The patient was advised to call back or see an in-person evaluation if the symptoms worsen or if the condition fails to improve as anticipated.   6 minutes of total time was spent for this patient encounter, including preparation, phone counseling with the patient and coordination of care, and documentation of  the encounter.   Comer Dollar, MD  Division of Gynecologic Oncology  Department of Obstetrics and Gynecology  [facility]

## 2024-01-27 ENCOUNTER — Telehealth: Payer: Self-pay | Admitting: *Deleted

## 2024-01-27 NOTE — Telephone Encounter (Signed)
 Per provider moved appt from 9/12 at 4 pm to 9/11 at 8:45 am. Patient aware

## 2024-01-28 ENCOUNTER — Telehealth: Payer: Self-pay | Admitting: *Deleted

## 2024-01-28 NOTE — Telephone Encounter (Signed)
 Per provider moved appt from 8:45 am to 2:30 pm on 9/11. Patient aware

## 2024-01-30 ENCOUNTER — Inpatient Hospital Stay: Admitting: Gynecologic Oncology

## 2024-01-30 ENCOUNTER — Encounter: Payer: Self-pay | Admitting: Gynecologic Oncology

## 2024-01-30 VITALS — BP 105/55 | HR 64 | Temp 98.0°F | Resp 18 | Wt 185.0 lb

## 2024-01-30 DIAGNOSIS — N83201 Unspecified ovarian cyst, right side: Secondary | ICD-10-CM

## 2024-01-30 DIAGNOSIS — D270 Benign neoplasm of right ovary: Secondary | ICD-10-CM

## 2024-01-30 DIAGNOSIS — Z90722 Acquired absence of ovaries, bilateral: Secondary | ICD-10-CM

## 2024-01-30 NOTE — Patient Instructions (Addendum)
 It was good seeing you today.   Continue with your post-operative restrictions of no heavy lifting for total of 6 weeks.  No follow up needed with GYN Oncology at this time unless needs arise in the future. Plan on follow up with your regular providers for your continued well woman care.   Please call the office for any needs related to your recent surgery.

## 2024-01-30 NOTE — Progress Notes (Signed)
 Gynecologic Oncology Return Clinic Visit  01/30/24  Reason for Visit: follow-up   Treatment History: 01/16/24: Robotic-assisted laparoscopic lysis of adhesions and enterolysis (for approximately 30 minutes), bilateral salpingo-oophorectomy, excision of appendiceal stump   Interval History: Doing well.  Reports occasional twinges related to her incisions.  Denies any significant abdominal pain.  Reports good bowel and bladder function.  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   REFRACTIVE SURGERY Bilateral 2004   ROBOTIC ASSISTED BILATERAL SALPINGO OOPHERECTOMY Bilateral 01/16/2024   Procedure: SALPINGO-OOPHORECTOMY, BILATERAL, ROBOT-ASSISTED,;  Surgeon: Viktoria Comer SAUNDERS, MD;  Location: [facility];  Service: Gynecology;  Laterality: Bilateral;   ROBOTIC ASSISTED LAPAROSCOPIC LYSIS OF ADHESION  01/16/2024   Procedure: LYSIS, ADHESIONS, ROBOT-ASSISTED, LAPAROSCOPIC;  Surgeon: Viktoria Comer SAUNDERS, MD;  Location: [facility];  Service: Gynecology;;   TONSILECTOMY, ADENOIDECTOMY, BILATERAL MYRINGOTOMY AND TUBES     VAGINAL HYSTERECTOMY     Patient thinks this was done in 90's.   WISDOM TOOTH EXTRACTION     XI ROBOTIC LAPAROSCOPIC ASSISTED APPENDECTOMY  01/16/2024   Procedure: APPENDECTOMY, ROBOT-ASSISTED, LAPAROSCOPIC;  Surgeon: Viktoria Comer SAUNDERS, MD;  Location: [facility];  Service: Gynecology;;    Family History  Problem Relation Age of Onset   Pancreatic cancer Half-Brother    Breast cancer  Neg Hx    Prostate cancer Neg Hx    Colon cancer Neg Hx    Ovarian cancer Neg Hx    Endometrial cancer Neg Hx     Social History   Socioeconomic History   Marital status: Married    Spouse name: Not on file   Number of children: Not on file   Years of education: Not on file   Highest education level: Not on file  Occupational History   Occupation: retired  Tobacco Use   Smoking status: Never   Smokeless tobacco: Never  Vaping Use   Vaping status: Never Used  Substance and Sexual Activity   Alcohol use: No   Drug use: Never   Sexual activity: Not Currently  Other Topics Concern   Not on file  Social History Narrative   Not on file   Social Drivers of Health   Financial Resource Strain: Not on file  Food Insecurity: Not on file  Transportation Needs: Not on file  Physical Activity: Not on file  Stress: Not on file  Social Connections: Not on file    Current Medications:  Current Outpatient Medications:    alendronate (FOSAMAX) 70 MG tablet, Take 70 mg by mouth once a week. Take with a full glass of water  on an empty stomach., Disp: , Rfl:    Cholecalciferol (VITAMIN D) 50 MCG (2000 UT) tablet, Take 4,000 Units by mouth daily., Disp: , Rfl:    cholestyramine (QUESTRAN) 4 g packet, Take 4 g by mouth daily., Disp: , Rfl: 9   loratadine (  CLARITIN) 5 MG chewable tablet, Chew 5 mg by mouth daily., Disp: , Rfl:    MAGNESIUM GLYCINATE PO, Take 420 mg by mouth at bedtime., Disp: , Rfl:    Multiple Vitamin (MULTIVITAMIN+) LIQD, Take 15 mLs by mouth daily. [employer] Women's Multivitamin, Disp: , Rfl:   Review of Systems: + Ringing in ears, dizziness, blood in urine Denies appetite changes, fevers, chills, fatigue, unexplained weight changes. Denies hearing loss, neck lumps or masses, mouth sores, or voice changes. Denies cough or wheezing.  Denies shortness of breath. Denies chest pain or palpitations. Denies leg swelling. Denies abdominal distention, pain, blood in  stools, constipation, diarrhea, nausea, vomiting, or early satiety. Denies pain with intercourse, dysuria, frequency or incontinence. Denies hot flashes, pelvic pain, vaginal bleeding or vaginal discharge.   Denies joint pain, back pain or muscle pain/cramps. Denies itching, rash, or wounds. Denies headaches, numbness or seizures. Denies swollen lymph nodes or glands, denies easy bruising or bleeding. Denies anxiety, depression, confusion, or decreased concentration.  Physical Exam: BP (!) 105/55 (BP Location: Left Arm, Patient Position: Sitting)   Pulse 64   Temp 98 F (36.7 C) (Oral)   Resp 18   Wt 185 lb (83.9 kg)   SpO2 98%   BMI 28.55 kg/m  General: Alert, oriented, no acute distress. HEENT: Posterior oropharynx clear, sclera anicteric. Chest: Unlabored breathing on room air. Abdomen: soft, nontender.  Normoactive bowel sounds.  No masses or hepatosplenomegaly appreciated.  Well-healed incisions. Extremities: Grossly normal range of motion.  Warm, well perfused.  No edema bilaterally.  Laboratory & Radiologic Studies: A. FALLOPIAN TUBE, OVARY, RIGHT, SALPINGO OOPHORECTOMY:  - Ovarian seromucinous cystadenoma.  - Fallopian tube with fimbriated end.  - Negative for malignancy.   B. FALLOPIAN TUBE, OVARY, LEFT, SALPINGO OOPHORECTOMY:  - Ovary with cortical inclusion cysts.  - Fallopian tube with fimbriated end.  - Negative for malignancy.   C. APPENDICEAL STUMP, APPENDECTOMY:  - Appendiceal stump, status post appendectomy.  - Negative for malignancy.    FINAL MICROSCOPIC DIAGNOSIS:  - No malignant cells identified   Assessment & Plan: Tracy Frye is a 68 y.o. woman s/p robotic BSO, lysis of adhesions and excision of appendiceal stump. Benign final pathology.   Patient overall doing well.  Discussed continued expectations and restrictions.  She was given a copy of her pathology report which we reviewed today.    16 minutes of total time was spent for this  patient encounter, including preparation, face-to-face counseling with the patient and coordination of care, and documentation of the encounter.  Comer Dollar, MD  Division of Gynecologic Oncology  Department of Obstetrics and Gynecology  [facility] [place]  [facility]

## 2024-01-31 ENCOUNTER — Telehealth: Payer: Self-pay | Admitting: *Deleted

## 2024-01-31 ENCOUNTER — Inpatient Hospital Stay: Admitting: Gynecologic Oncology

## 2024-01-31 NOTE — Telephone Encounter (Signed)
 Spoke with Tracy Frye who returned call from the office. Patient states her dizziness has resolved and she hasn't had any dizziness in the last couple of days. Pt thanked the office for calling and is aware to reach out with any further concerns.

## 2024-01-31 NOTE — Telephone Encounter (Signed)
 Attempted to reach patient to check in on her symptoms. Left voicemail requesting call back.

## 2024-02-15 ENCOUNTER — Other Ambulatory Visit (HOSPITAL_BASED_OUTPATIENT_CLINIC_OR_DEPARTMENT_OTHER): Payer: Self-pay | Admitting: Family Medicine

## 2024-02-15 DIAGNOSIS — M85852 Other specified disorders of bone density and structure, left thigh: Secondary | ICD-10-CM

## 2024-02-24 ENCOUNTER — Encounter (HOSPITAL_BASED_OUTPATIENT_CLINIC_OR_DEPARTMENT_OTHER): Payer: Self-pay | Admitting: Family Medicine

## 2024-02-24 DIAGNOSIS — E785 Hyperlipidemia, unspecified: Secondary | ICD-10-CM

## 2024-02-25 ENCOUNTER — Encounter (HOSPITAL_BASED_OUTPATIENT_CLINIC_OR_DEPARTMENT_OTHER): Payer: Self-pay | Admitting: Family Medicine

## 2024-02-25 DIAGNOSIS — E785 Hyperlipidemia, unspecified: Secondary | ICD-10-CM

## 2024-02-25 DIAGNOSIS — E782 Mixed hyperlipidemia: Secondary | ICD-10-CM

## 2024-03-23 ENCOUNTER — Encounter: Payer: Self-pay | Admitting: Radiology

## 2024-04-22 ENCOUNTER — Telehealth: Payer: Self-pay | Admitting: Physical Medicine and Rehabilitation

## 2024-04-22 NOTE — Telephone Encounter (Signed)
 Patient called. Would like an appointment with Dr. Alvester Morin

## 2024-04-23 NOTE — Telephone Encounter (Signed)
 LMX1 to schedule OV with Megan

## 2024-05-04 ENCOUNTER — Encounter: Payer: Self-pay | Admitting: Physical Medicine and Rehabilitation

## 2024-05-04 ENCOUNTER — Ambulatory Visit: Admitting: Physical Medicine and Rehabilitation

## 2024-05-04 DIAGNOSIS — M47816 Spondylosis without myelopathy or radiculopathy, lumbar region: Secondary | ICD-10-CM

## 2024-05-04 DIAGNOSIS — M47819 Spondylosis without myelopathy or radiculopathy, site unspecified: Secondary | ICD-10-CM

## 2024-05-04 DIAGNOSIS — G8929 Other chronic pain: Secondary | ICD-10-CM | POA: Diagnosis not present

## 2024-05-04 DIAGNOSIS — M545 Low back pain, unspecified: Secondary | ICD-10-CM | POA: Diagnosis not present

## 2024-05-04 NOTE — Progress Notes (Unsigned)
 Pain Scale   Average Pain 10 Patient advising she has lower back pain that is constant.        +Driver, -BT, -Dye Allergies.

## 2024-05-04 NOTE — Progress Notes (Unsigned)
 Tracy Frye - 68 y.o. female MRN [id number]  Date of birth: 18-Nov-1955  Office Visit Note: Visit Date: 05/04/2024 PCP: Cleotilde Planas, MD Referred by: Cleotilde Planas, MD  Subjective: Chief Complaint  Patient presents with   Lower Back - Pain   HPI: Tracy Frye is a 68 y.o. female who comes in today for evaluation of chronic, worsening and severe bilateral lower back pain. She reports chronic lower back pain issues for several years, typically her pain radiates down right leg, however her pain today is localized to axial lower back. Lower back pain became severe in November. Her pain worsens with standing, sitting and laying down. She reports prolonged standing with cooking causes severe discomfort. She describes pain as sharp and spasm sensation, currently rates as 10 out of 10. Some relief of pain with home exercise regimen, rest, TENS, and use of medications. Recent lumbar MRI imaging shows multilevel lumbar spondylosis, worst at L4-5 where there is moderate narrowing of the right lateral recess with mass effect on the traversing right L5 nerve root in the subarticular zone. Moderate bilateral neural foraminal narrowing at this level. No high grade spinal canal stenosis noted. She underwent right L4-L5 interlaminar epidural steroid injection in our office on 08/19/2023. She reports 100% relief of pain for about 1 month. Patient denies focal weakness, numbness and tingling. No recent trauma or falls.   Of note, there were abnormal findings on lumbar MRI imaging from June of this year. Moderate left hydroureter, increased from prior. Further characterization with CT of the abdomen and pelvis without contrast is recommended to evaluate for urolithiasis. She did undergo CT of abdomen/pelvis in July of this year that shows multiseptated, right ovarian cyst, measuring 5.3 x 3.7 x 4.6 cm. She followed up with OBGYN and elected to undergo bilateral salpingo-oophorectomy in August of this year.  She is doing well post surgery, has completed follow up with gynecologic oncology.      Review of Systems  Musculoskeletal:  Positive for back pain.  Neurological:  Negative for tingling, sensory change, focal weakness and weakness.  All other systems reviewed and are negative.  Otherwise per HPI.  Assessment & Plan: Visit Diagnoses:    ICD-10-CM   1. Chronic bilateral low back pain without sciatica  M54.50 Ambulatory referral to Physical Medicine Rehab   G89.29     2. Spondylosis without myelopathy or radiculopathy  M47.819 Ambulatory referral to Physical Medicine Rehab    3. Facet arthropathy, lumbar  M47.816 Ambulatory referral to Physical Medicine Rehab       Plan: Findings:  Chronic, worsening and severe bilateral lower back pain. No radicular symptoms down the legs. Patient continues to have pain despite good conservative therapies such as home exercise regimen, rest and use of medications. Patients clinical presentation and exam are consistent with facet mediated pain. She has pain with lumbar extension today. There is facet arthropathy at the level of L4-L5. We discussed treatment plan in detail today. Next step is to perform bilateral L4-L5 facet joint injections under fluoroscopic guidance. If good relief of pain with facet injections we discussed possibility of longer sustained pain relief with radiofrequency ablation. Should her pain present as more radicular in nature we would consider repeating lumbar epidural steroid injection. There is right sided lateral recess narrowing at L4-L5. She has no questions today. I encouraged her to remain active as tolerated. Her exam today is non focal, good strength noted to bilateral lower extremities.     Meds &  Orders: No orders of the defined types were placed in this encounter.   Orders Placed This Encounter  Procedures   Ambulatory referral to Physical Medicine Rehab    Follow-up: Return for Bilateral L4-L5 facet joint  injections.   Procedures: No procedures performed      Clinical History: MRI LUMBAR SPINE WITHOUT CONTRAST   TECHNIQUE: Multiplanar, multisequence MR imaging of the lumbar spine was performed. No intravenous contrast was administered.   COMPARISON:  MRI lumbar spine 03/25/2017.   FINDINGS: Segmentation: Conventional numbering is assumed with 5 non-rib-bearing, lumbar type vertebral bodies.   Alignment:  Unchanged trace retrolisthesis of L4 on L5 and L5 on S1.   Vertebrae: Normal vertebral body heights. Mixed Modic type 1 and 2 degenerative endplate marrow signal changes at L4-5, slightly improved from prior.   Conus medullaris and cauda equina: Conus extends to the L1 level. Conus and cauda equina appear normal.   Paraspinal and other soft tissues: Moderate fatty atrophy of the paraspinal muscles. Moderate left hydroureter, increased from prior.   Disc levels:   T12-L1:  Normal.   L1-L2: Small disc bulge and mild bilateral facet arthropathy. No spinal canal stenosis or neural foraminal narrowing.   L2-L3: Left eccentric disc bulge and facet arthropathy results in moderate left neural foraminal narrowing. No spinal canal stenosis.   L3-L4: Small disc bulge and mild bilateral facet arthropathy. No spinal canal stenosis or neural foraminal narrowing.   L4-L5: Right eccentric disc bulge and facet arthropathy results in moderate narrowing of the right lateral recess with mass effect on the traversing right L5 nerve root in the subarticular zone. Moderate bilateral neural foraminal narrowing.   L5-S1: Moderate disc height loss. No spinal canal stenosis or neural foraminal narrowing.   IMPRESSION: 1. Multilevel lumbar spondylosis, worst at L4-5 where there is moderate narrowing of the right lateral recess with mass effect on the traversing right L5 nerve root in the subarticular zone. Moderate bilateral neural foraminal narrowing at this level. 2. Moderate left  neural foraminal narrowing at L2-L3. 3. Moderate left hydroureter, increased from prior. Further characterization with CT of the abdomen and pelvis without contrast is recommended to evaluate for urolithiasis.   These results will be called to the ordering clinician or representative by the Radiologist Assistant, and communication documented in the PACS or [employer].     Electronically Signed   By: Ryan Chess M.D.   On: 11/18/2023 09:51   She reports that she has never smoked. She has never used smokeless tobacco. No results for input(s): HGBA1C, LABURIC in the last 8760 hours.  Objective:  VS:  HT:    WT:   BMI:     BP:   HR: bpm  TEMP: ( )  RESP:  Physical Exam Vitals and nursing note reviewed.  HENT:     Head: Normocephalic and atraumatic.     Right Ear: External ear normal.     Left Ear: External ear normal.     Nose: Nose normal.     Mouth/Throat:     Mouth: Mucous membranes are moist.  Eyes:     Extraocular Movements: Extraocular movements intact.  Cardiovascular:     Rate and Rhythm: Normal rate.     Pulses: Normal pulses.  Pulmonary:     Effort: Pulmonary effort is normal.  Abdominal:     General: Abdomen is flat. There is no distension.  Musculoskeletal:        General: Tenderness present.     Cervical back:  Normal range of motion.     Comments: Patient rises from seated position to standing without difficulty. Pain noted with facet loading and lumbar extension. 5/5 strength noted with bilateral hip flexion, knee flexion/extension, ankle dorsiflexion/plantarflexion and EHL. No clonus noted bilaterally. No pain upon palpation of greater trochanters. No pain with internal/external rotation of bilateral hips. Sensation intact bilaterally. Negative slump test bilaterally. Ambulates without aid, gait steady.     Skin:    General: Skin is warm and dry.     Capillary Refill: Capillary refill takes less than 2 seconds.  Neurological:     General: No  focal deficit present.     Mental Status: She is alert and oriented to person, place, and time.  Psychiatric:        Mood and Affect: Mood normal.        Behavior: Behavior normal.     Ortho Exam  Imaging: No results found.  Past Medical/Family/Surgical/Social History: Medications & Allergies reviewed per EMR, new medications updated. 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   Past Medical History:  Diagnosis Date   Anxiety    Arthritis    Fatty liver    History of endometriosis    History of kidney stones    Hypertension    Migraine    Osteopenia    Pneumonia    Uterine fibroid    hysterectomy   Family History  Problem Relation Age of Onset   Pancreatic cancer Half-Brother    Breast cancer Neg Hx    Prostate cancer Neg Hx    Colon cancer Neg Hx    Ovarian cancer Neg Hx    Endometrial cancer Neg Hx    Past Surgical History:  Procedure Laterality Date   APPENDECTOMY     CHOLECYSTECTOMY     laparoscopic   EYE SURGERY Bilateral 2024   cataract extraction   FOOT SURGERY Left 2019   FRACTURE SURGERY Right    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   REFRACTIVE SURGERY Bilateral 2004   ROBOTIC ASSISTED BILATERAL SALPINGO OOPHERECTOMY Bilateral 01/16/2024   Procedure: SALPINGO-OOPHORECTOMY, BILATERAL, ROBOT-ASSISTED,;  Surgeon: Viktoria Comer SAUNDERS, MD;  Location: [facility];  Service: Gynecology;  Laterality: Bilateral;   ROBOTIC ASSISTED LAPAROSCOPIC LYSIS OF ADHESION  01/16/2024   Procedure: LYSIS, ADHESIONS, ROBOT-ASSISTED, LAPAROSCOPIC;  Surgeon: Viktoria Comer SAUNDERS, MD;  Location: [facility];  Service: Gynecology;;   TONSILECTOMY, ADENOIDECTOMY, BILATERAL MYRINGOTOMY  AND TUBES     VAGINAL HYSTERECTOMY     Patient thinks this was done in 90's.   WISDOM TOOTH EXTRACTION     XI ROBOTIC LAPAROSCOPIC ASSISTED APPENDECTOMY  01/16/2024   Procedure: APPENDECTOMY, ROBOT-ASSISTED, LAPAROSCOPIC;  Surgeon: Viktoria Comer SAUNDERS, MD;  Location: [facility];  Service: Gynecology;;   Social History   Occupational History   Occupation: retired  Tobacco Use   Smoking status: Never   Smokeless tobacco: Never  Vaping Use   Vaping status: Never Used  Substance and Sexual Activity   Alcohol use: No   Drug use: Never   Sexual activity: Not Currently

## 2024-05-20 ENCOUNTER — Ambulatory Visit

## 2024-05-20 DIAGNOSIS — M85852 Other specified disorders of bone density and structure, left thigh: Secondary | ICD-10-CM

## 2024-05-24 ENCOUNTER — Other Ambulatory Visit (HOSPITAL_BASED_OUTPATIENT_CLINIC_OR_DEPARTMENT_OTHER): Payer: Self-pay | Admitting: Family Medicine

## 2024-05-24 DIAGNOSIS — E782 Mixed hyperlipidemia: Secondary | ICD-10-CM

## 2024-06-01 ENCOUNTER — Other Ambulatory Visit: Payer: Self-pay

## 2024-06-01 ENCOUNTER — Ambulatory Visit: Admitting: Physical Medicine and Rehabilitation

## 2024-06-01 VITALS — BP 113/76 | HR 73

## 2024-06-01 DIAGNOSIS — M47816 Spondylosis without myelopathy or radiculopathy, lumbar region: Secondary | ICD-10-CM | POA: Diagnosis not present

## 2024-06-01 MED ORDER — METHYLPREDNISOLONE ACETATE 40 MG/ML IJ SUSP
40.0000 mg | Freq: Once | INTRAMUSCULAR | Status: AC
  Administered 2024-06-01: 40 mg

## 2024-06-01 NOTE — Progress Notes (Signed)
 "  Tracy Frye - 69 y.o. female MRN [id number]  Date of birth: August 26, 1955  Office Visit Note: Visit Date: 06/01/2024 PCP: Cleotilde Planas, MD Referred by: Cleotilde Planas, MD  Subjective: Chief Complaint  Patient presents with   Lower Back - Pain   HPI:  Tracy Frye is a 69 y.o. female who comes in today at the request of Duwaine Pouch, FNP for planned Bilateral  L4-5 Lumbar facet/medial branch block with fluoroscopic guidance.  The patient has failed conservative care including home exercise, medications, time and activity modification.  This injection will be diagnostic and hopefully therapeutic.  Please see requesting physician notes for further details and justification.  Exam has shown concordant pain with facet joint loading.   ROS Otherwise per HPI.  Assessment & Plan: Visit Diagnoses:    ICD-10-CM   1. Spondylosis without myelopathy or radiculopathy, lumbar region  M47.816 XR C-ARM NO REPORT    Facet Injection    methylPREDNISolone  acetate (DEPO-MEDROL ) injection 40 mg      Plan: No additional findings.   Meds & Orders:  Meds ordered this encounter  Medications   methylPREDNISolone  acetate (DEPO-MEDROL ) injection 40 mg    Orders Placed This Encounter  Procedures   Facet Injection   XR C-ARM NO REPORT    Follow-up: Return for visit to requesting provider as needed.   Procedures: No procedures performed  Lumbar Facet Joint Intra-Articular Injection(s) with Fluoroscopic Guidance  Patient: Destenie Frye      Date of Birth: 13-Jul-1955 MRN: [id number] PCP: Cleotilde Planas, MD      Visit Date: 06/01/2024   Universal Protocol:    Date/Time: 06/01/2024  Consent Given By: the patient  Position: PRONE   Additional Comments: Vital signs were monitored before and after the procedure. Patient was prepped and draped in the usual sterile fashion. The correct patient, procedure, and site was verified.   Injection Procedure Details:  Procedure Site  One Meds Administered:  Meds ordered this encounter  Medications   methylPREDNISolone  acetate (DEPO-MEDROL ) injection 40 mg     Laterality: Bilateral  Location/Site:  L4-L5  Needle size: 22 guage  Needle type: Spinal  Needle Placement: Articular  Findings:  -Comments: Excellent flow of contrast producing a partial arthrogram.  Procedure Details: The fluoroscope beam is vertically oriented in AP, and the inferior recess is visualized beneath the lower pole of the inferior apophyseal process, which represents the target point for needle insertion. When direct visualization is difficult the target point is located at the medial projection of the vertebral pedicle. The region overlying each aforementioned target is locally anesthetized with a 1 to 2 ml. volume of 1% Lidocaine  without Epinephrine .   The spinal needle was inserted into each of the above mentioned facet joints using biplanar fluoroscopic guidance. A 0.25 to 0.5 ml. volume of Isovue-250 was injected and a partial facet joint arthrogram was obtained. A single spot film was obtained of the resulting arthrogram.    One to 1.25 ml of the steroid/anesthetic solution was then injected into each of the facet joints noted above.   Additional Comments:  No complications occurred Dressing: 2 x 2 sterile gauze and Band-Aid    Post-procedure details: Patient was observed during the procedure. Post-procedure instructions were reviewed.  Patient left the clinic in stable condition.    Clinical History: MRI LUMBAR SPINE WITHOUT CONTRAST   TECHNIQUE: Multiplanar, multisequence MR imaging of the lumbar spine was performed. No intravenous contrast was administered.   COMPARISON:  MRI  lumbar spine 03/25/2017.   FINDINGS: Segmentation: Conventional numbering is assumed with 5 non-rib-bearing, lumbar type vertebral bodies.   Alignment:  Unchanged trace retrolisthesis of L4 on L5 and L5 on S1.   Vertebrae: Normal vertebral  body heights. Mixed Modic type 1 and 2 degenerative endplate marrow signal changes at L4-5, slightly improved from prior.   Conus medullaris and cauda equina: Conus extends to the L1 level. Conus and cauda equina appear normal.   Paraspinal and other soft tissues: Moderate fatty atrophy of the paraspinal muscles. Moderate left hydroureter, increased from prior.   Disc levels:   T12-L1:  Normal.   L1-L2: Small disc bulge and mild bilateral facet arthropathy. No spinal canal stenosis or neural foraminal narrowing.   L2-L3: Left eccentric disc bulge and facet arthropathy results in moderate left neural foraminal narrowing. No spinal canal stenosis.   L3-L4: Small disc bulge and mild bilateral facet arthropathy. No spinal canal stenosis or neural foraminal narrowing.   L4-L5: Right eccentric disc bulge and facet arthropathy results in moderate narrowing of the right lateral recess with mass effect on the traversing right L5 nerve root in the subarticular zone. Moderate bilateral neural foraminal narrowing.   L5-S1: Moderate disc height loss. No spinal canal stenosis or neural foraminal narrowing.   IMPRESSION: 1. Multilevel lumbar spondylosis, worst at L4-5 where there is moderate narrowing of the right lateral recess with mass effect on the traversing right L5 nerve root in the subarticular zone. Moderate bilateral neural foraminal narrowing at this level. 2. Moderate left neural foraminal narrowing at L2-L3. 3. Moderate left hydroureter, increased from prior. Further characterization with CT of the abdomen and pelvis without contrast is recommended to evaluate for urolithiasis.   These results will be called to the ordering clinician or representative by the Radiologist Assistant, and communication documented in the PACS or [employer].     Electronically Signed   By: Ryan Chess M.D.   On: 11/18/2023 09:51     Objective:  VS:  HT:    WT:   BMI:      BP:113/76  HR:73bpm  TEMP: ( )  RESP:  Physical Exam Vitals and nursing note reviewed.  Constitutional:      General: She is not in acute distress.    Appearance: Normal appearance. She is not ill-appearing.  HENT:     Head: Normocephalic and atraumatic.     Right Ear: External ear normal.     Left Ear: External ear normal.  Eyes:     Extraocular Movements: Extraocular movements intact.  Cardiovascular:     Rate and Rhythm: Normal rate.     Pulses: Normal pulses.  Pulmonary:     Effort: Pulmonary effort is normal. No respiratory distress.  Abdominal:     General: There is no distension.     Palpations: Abdomen is soft.  Musculoskeletal:        General: Tenderness present.     Cervical back: Neck supple.     Right lower leg: No edema.     Left lower leg: No edema.     Comments: Patient has good distal strength with no pain over the greater trochanters.  No clonus or focal weakness.  Skin:    Findings: No erythema, lesion or rash.  Neurological:     General: No focal deficit present.     Mental Status: She is alert and oriented to person, place, and time.     Sensory: No sensory deficit.     Motor:  No weakness or abnormal muscle tone.     Coordination: Coordination normal.  Psychiatric:        Mood and Affect: Mood normal.        Behavior: Behavior normal.      Imaging: No results found. "

## 2024-06-01 NOTE — Progress Notes (Signed)
 Pain Scale   Average Pain 7 Patient advising she has lower back pain that increases in pain when walking and standing pain get worse during the day and night        +Driver, -BT, -Dye Allergies.

## 2024-06-01 NOTE — Procedures (Signed)
 Lumbar Facet Joint Intra-Articular Injection(s) with Fluoroscopic Guidance  Patient: Tracy Frye      Date of Birth: Aug 15, 1955 MRN: [id number] PCP: Cleotilde Planas, MD      Visit Date: 06/01/2024   Universal Protocol:    Date/Time: 06/01/2024  Consent Given By: the patient  Position: PRONE   Additional Comments: Vital signs were monitored before and after the procedure. Patient was prepped and draped in the usual sterile fashion. The correct patient, procedure, and site was verified.   Injection Procedure Details:  Procedure Site One Meds Administered:  Meds ordered this encounter  Medications   methylPREDNISolone  acetate (DEPO-MEDROL ) injection 40 mg     Laterality: Bilateral  Location/Site:  L4-L5  Needle size: 22 guage  Needle type: Spinal  Needle Placement: Articular  Findings:  -Comments: Excellent flow of contrast producing a partial arthrogram.  Procedure Details: The fluoroscope beam is vertically oriented in AP, and the inferior recess is visualized beneath the lower pole of the inferior apophyseal process, which represents the target point for needle insertion. When direct visualization is difficult the target point is located at the medial projection of the vertebral pedicle. The region overlying each aforementioned target is locally anesthetized with a 1 to 2 ml. volume of 1% Lidocaine  without Epinephrine .   The spinal needle was inserted into each of the above mentioned facet joints using biplanar fluoroscopic guidance. A 0.25 to 0.5 ml. volume of Isovue-250 was injected and a partial facet joint arthrogram was obtained. A single spot film was obtained of the resulting arthrogram.    One to 1.25 ml of the steroid/anesthetic solution was then injected into each of the facet joints noted above.   Additional Comments:  No complications occurred Dressing: 2 x 2 sterile gauze and Band-Aid    Post-procedure details: Patient was observed during the  procedure. Post-procedure instructions were reviewed.  Patient left the clinic in stable condition.

## 2024-06-09 ENCOUNTER — Ambulatory Visit (HOSPITAL_BASED_OUTPATIENT_CLINIC_OR_DEPARTMENT_OTHER)
Admission: RE | Admit: 2024-06-09 | Discharge: 2024-06-09 | Disposition: A | Discharge status: Home / Self Care | Source: Ambulatory Visit | Attending: Family Medicine | Admitting: Family Medicine

## 2024-06-09 DIAGNOSIS — E782 Mixed hyperlipidemia: Secondary | ICD-10-CM | POA: Insufficient documentation

## 2024-06-11 ENCOUNTER — Ambulatory Visit: Admitting: Orthopaedic Surgery

## 2024-06-11 DIAGNOSIS — M25562 Pain in left knee: Secondary | ICD-10-CM

## 2024-06-11 DIAGNOSIS — G8929 Other chronic pain: Secondary | ICD-10-CM

## 2024-06-11 MED ORDER — METHYLPREDNISOLONE ACETATE 40 MG/ML IJ SUSP
40.0000 mg | INTRAMUSCULAR | Status: AC | PRN
  Administered 2024-06-11: 40 mg via INTRA_ARTICULAR

## 2024-06-11 MED ORDER — LIDOCAINE HCL 1 % IJ SOLN
3.0000 mL | INTRAMUSCULAR | Status: AC | PRN
  Administered 2024-06-11: 3 mL

## 2024-06-11 NOTE — Progress Notes (Signed)
 The patient is an active 69 year old female that we last saw in September 2024 with left knee pain and swelling.  At that time we aspirated about 25 cc of fluid from her knee and placed a steroid injection in her knee.  She says she was doing well into about the summer she was having some pain with her knee but was dealing with an ovarian cyst that needed surgery.  She is now coming to see us  after about 3 weeks of worsening left knee pain that is waking her up at night with no known injury.  On examination of her knee there is some effusion comparing the left and right knees.  There are some global tenderness as well but the knee does move well and is ligamentously stable.  I was able to aspirate about 20 cc of fluid off of her left knee that was clear and yellow.  I then placed a steroid injection in her left knee.  I let her know that if this does not help or her pain comes back sooner that she should come back and see us .  We would then obtain new x-rays of her left knee and potentially even consider MRI depending what the x-ray showed.  She agrees with this treatment plan.  All questions and concerns were addressed and answered.    Procedure Note  Patient: Isaac Lacson             Date of Birth: 1956-04-01           MRN: [id number]             Visit Date: 06/11/2024  Procedures: Visit Diagnoses:  1. Chronic pain of left knee     Large Joint Inj: L knee on 06/11/2024 4:25 PM Indications: diagnostic evaluation and pain Details: 22 G 1.5 in needle, superolateral approach  Arthrogram: No  Medications: 3 mL lidocaine  1 %; 40 mg methylPREDNISolone  acetate 40 MG/ML Outcome: tolerated well, no immediate complications Procedure, treatment alternatives, risks and benefits explained, specific risks discussed. Consent was given by the patient. Immediately prior to procedure a time out was called to verify the correct patient, procedure, equipment, support staff and site/side marked as  required. Patient was prepped and draped in the usual sterile fashion.
